# Patient Record
Sex: Female | Born: 1960 | Race: Black or African American | Hispanic: No | Marital: Single
Health system: Southern US, Community
[De-identification: ages and names within clinical notes are randomized; demographics above are authoritative.]

---

## 2021-07-15 ENCOUNTER — Emergency Department: Payer: BC Managed Care – PPO

## 2021-07-15 ENCOUNTER — Emergency Department
Admission: EM | Admit: 2021-07-15 | Discharge: 2021-07-15 | Disposition: A | Discharge status: Home / Self Care | Payer: BC Managed Care – PPO | Attending: Emergency Medicine | Admitting: Emergency Medicine

## 2021-07-15 DIAGNOSIS — I129 Hypertensive chronic kidney disease with stage 1 through stage 4 chronic kidney disease, or unspecified chronic kidney disease: Secondary | ICD-10-CM | POA: Diagnosis not present

## 2021-07-15 DIAGNOSIS — R531 Weakness: Secondary | ICD-10-CM | POA: Insufficient documentation

## 2021-07-15 DIAGNOSIS — R4182 Altered mental status, unspecified: Secondary | ICD-10-CM | POA: Insufficient documentation

## 2021-07-15 DIAGNOSIS — N189 Chronic kidney disease, unspecified: Secondary | ICD-10-CM | POA: Diagnosis not present

## 2021-07-15 LAB — CBC WITH DIFFERENTIAL/PLATELET
Abs Immature Granulocytes: 0.01 10*3/uL (ref 0.00–0.07)
Basophils Absolute: 0 10*3/uL (ref 0.0–0.1)
Basophils Relative: 1 %
Eosinophils Absolute: 0.2 10*3/uL (ref 0.0–0.5)
Eosinophils Relative: 3 %
HCT: 40.8 % (ref 36.0–46.0)
Hemoglobin: 12.3 g/dL (ref 12.0–15.0)
Immature Granulocytes: 0 %
Lymphocytes Relative: 36 %
Lymphs Abs: 1.8 10*3/uL (ref 0.7–4.0)
MCH: 26.5 pg (ref 26.0–34.0)
MCHC: 30.1 g/dL (ref 30.0–36.0)
MCV: 87.9 fL (ref 80.0–100.0)
Monocytes Absolute: 0.5 10*3/uL (ref 0.1–1.0)
Monocytes Relative: 9 %
Neutro Abs: 2.6 10*3/uL (ref 1.7–7.7)
Neutrophils Relative %: 51 %
Platelets: 281 10*3/uL (ref 150–400)
RBC: 4.64 MIL/uL (ref 3.87–5.11)
RDW: 14.6 % (ref 11.5–15.5)
WBC: 5.1 10*3/uL (ref 4.0–10.5)
nRBC: 0 % (ref 0.0–0.2)

## 2021-07-15 LAB — URINE DRUG SCREEN, QUALITATIVE (ARMC ONLY)
Amphetamines, Ur Screen: NOT DETECTED
Barbiturates, Ur Screen: NOT DETECTED
Benzodiazepine, Ur Scrn: NOT DETECTED
Cannabinoid 50 Ng, Ur ~~LOC~~: NOT DETECTED
Cocaine Metabolite,Ur ~~LOC~~: NOT DETECTED
MDMA (Ecstasy)Ur Screen: NOT DETECTED
Methadone Scn, Ur: NOT DETECTED
Opiate, Ur Screen: NOT DETECTED
Phencyclidine (PCP) Ur S: NOT DETECTED
Tricyclic, Ur Screen: POSITIVE — AB

## 2021-07-15 LAB — TROPONIN I (HIGH SENSITIVITY)
Troponin I (High Sensitivity): 5 ng/L (ref <18)
Troponin I (High Sensitivity): 4 ng/L (ref <18)

## 2021-07-15 LAB — COMPREHENSIVE METABOLIC PANEL
ALT: 17 U/L (ref 0–44)
AST: 24 U/L (ref 15–41)
Albumin: 3.8 g/dL (ref 3.5–5.0)
Alkaline Phosphatase: 94 U/L (ref 38–126)
Anion gap: 10 (ref 5–15)
BUN: 25 mg/dL — ABNORMAL HIGH (ref 6–20)
CO2: 21 mmol/L — ABNORMAL LOW (ref 22–32)
Calcium: 9.1 mg/dL (ref 8.9–10.3)
Chloride: 104 mmol/L (ref 98–111)
Creatinine, Ser: 1.52 mg/dL — ABNORMAL HIGH (ref 0.44–1.00)
GFR, Estimated: 39 mL/min — ABNORMAL LOW (ref >60)
Glucose, Bld: 132 mg/dL — ABNORMAL HIGH (ref 70–99)
Potassium: 4.6 mmol/L (ref 3.5–5.1)
Sodium: 135 mmol/L (ref 135–145)
Total Bilirubin: 0.6 mg/dL (ref 0.3–1.2)
Total Protein: 7.3 g/dL (ref 6.5–8.1)

## 2021-07-15 LAB — LACTIC ACID, PLASMA
Lactic Acid, Venous: 2.7 mmol/L (ref 0.5–1.9)
Lactic Acid, Venous: 0.8 mmol/L (ref 0.5–1.9)

## 2021-07-15 LAB — MAGNESIUM: Magnesium: 2.4 mg/dL (ref 1.7–2.4)

## 2021-07-15 LAB — CBG MONITORING, ED: Glucose-Capillary: 85 mg/dL (ref 70–99)

## 2021-07-15 IMAGING — CT CT HEAD W/O CM
4 series · 16 of 47 positions shown, 18 images · non-contrast
Comparison: None.

CLINICAL DATA: Altered mental status.



[Series 2: head wo · axial · 0.43mm/px · z∈[-91,+14]mm · 7 of 29 slices shown, 9 images]
[im 4/29  brain]
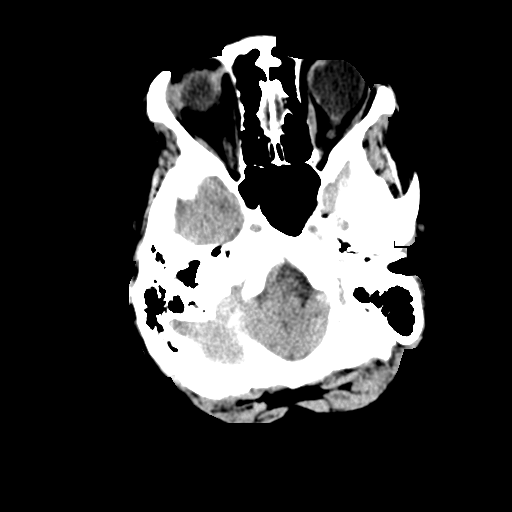
[im 4/29  bone]
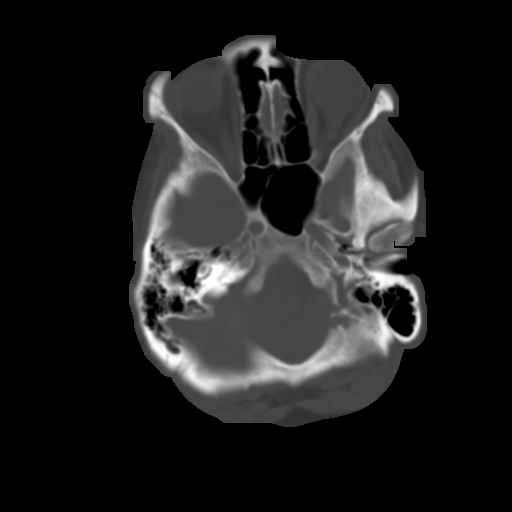
[im 8/29  brain]
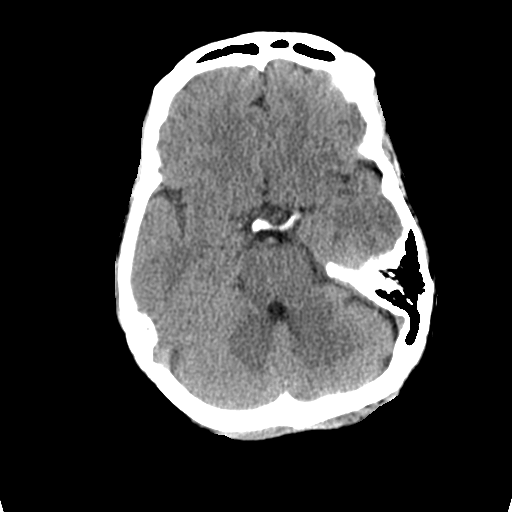
[im 11/29  brain]
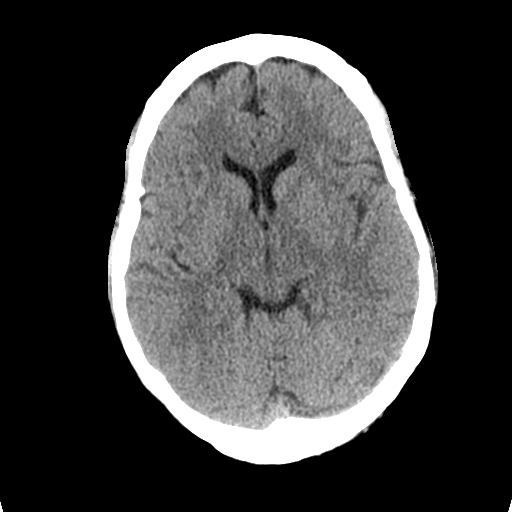
[im 15/29  brain]
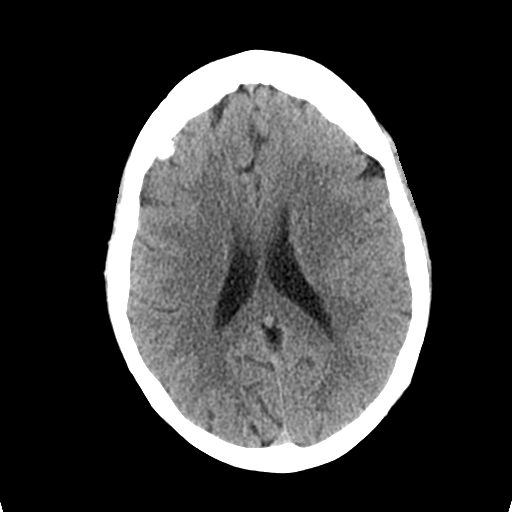
[im 18/29  brain]
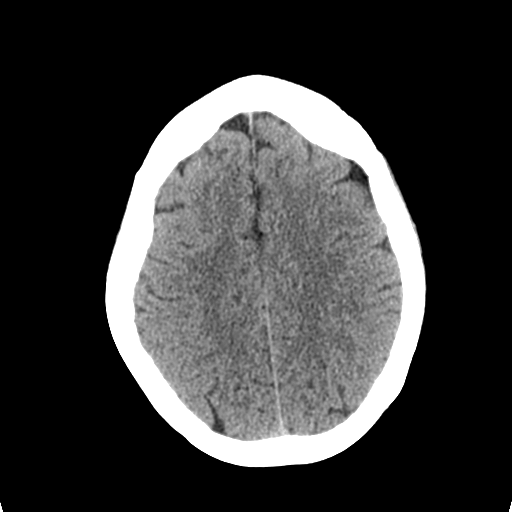
[im 18/29  bone]
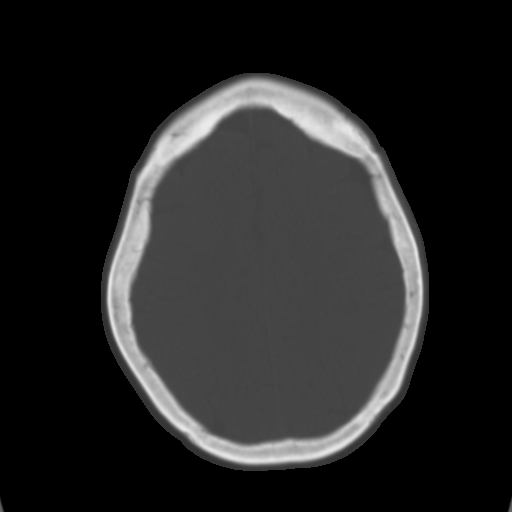
[im 22/29  brain]
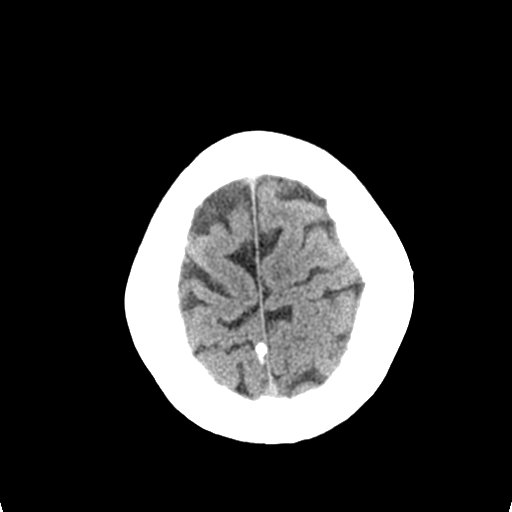
[im 25/29  brain]
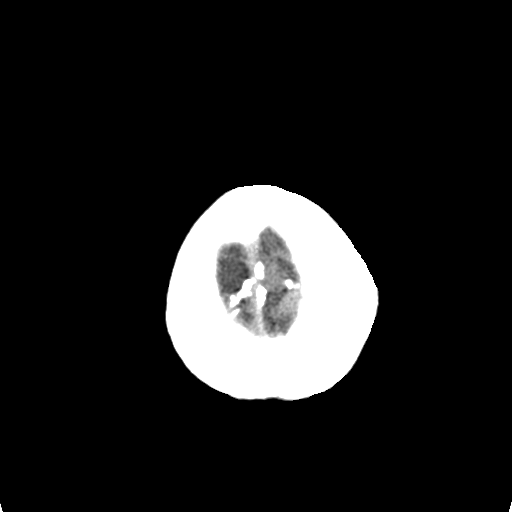

[Series 3: head bone · axial · 0.43mm/px · z∈[-92,-64]mm · 3 of 73 slices shown]
[im 8/73  bone]
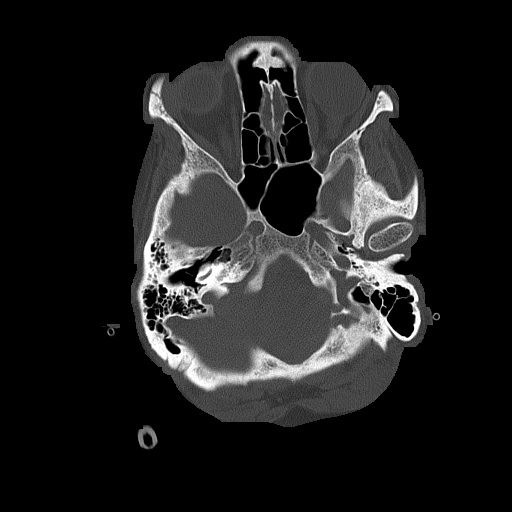
[im 15/73  bone]
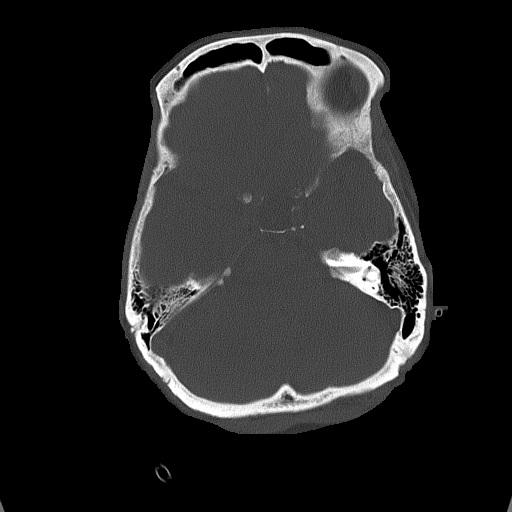
[im 22/73  bone]
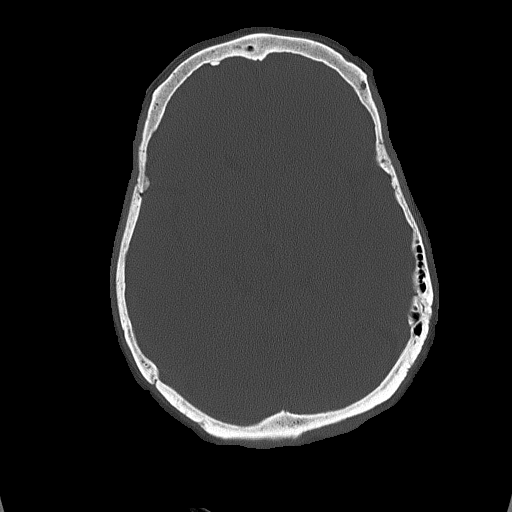

[Series 4: coronal soft tissue · coronal · 0.29mm/px · 3 of 66 slices shown]
[im 22/66  brain]
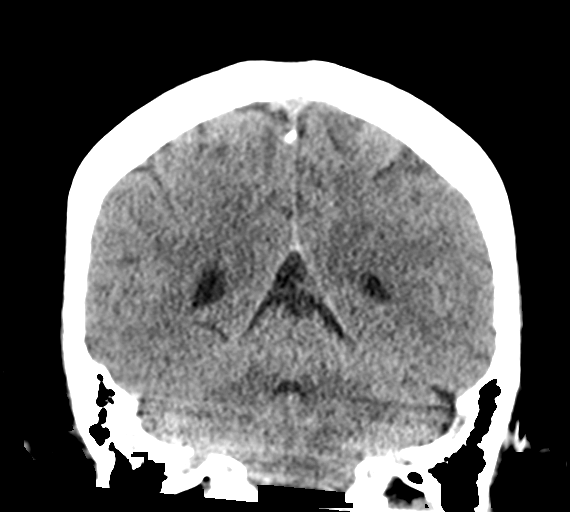
[im 29/66  brain]
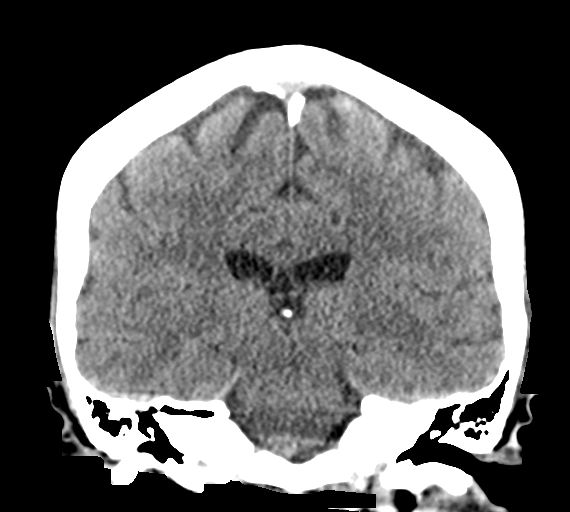
[im 37/66  brain]
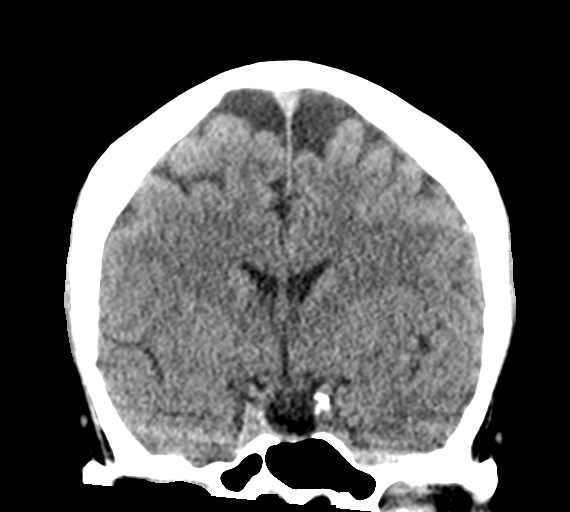

[Series 5: sagittal soft tissue · sagittal · 0.29mm/px · 3 of 56 slices shown]
[im 19/56  brain]
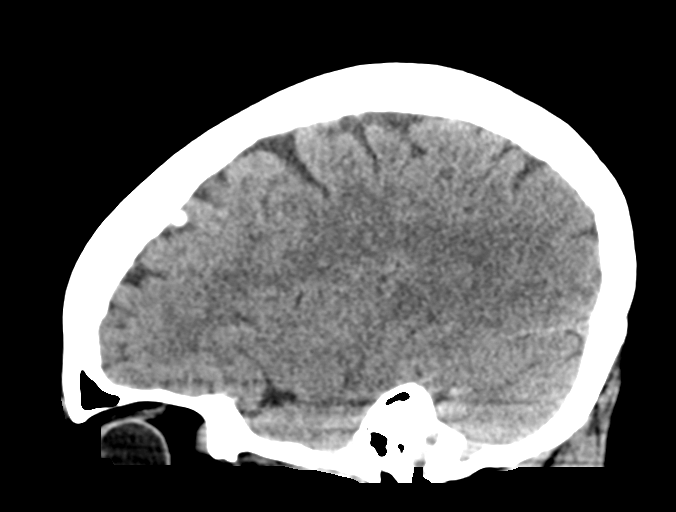
[im 28/56  brain]
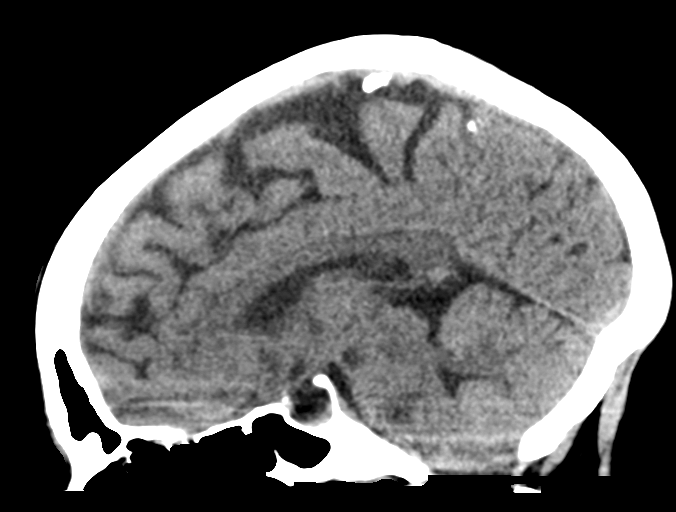
[im 37/56  brain]
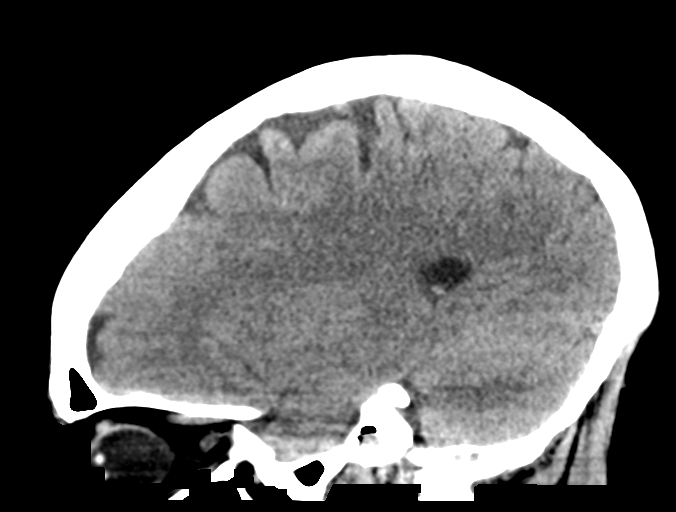

[16 of 47 positions shown; findings below may reference images not displayed]

FINDINGS: Brain: No evidence of acute infarction, hemorrhage, hydrocephalus,
extra-axial collection or mass lesion/mass effect.

Vascular: No hyperdense vessel or unexpected calcification.

Skull: Normal. Negative for fracture or focal lesion.

Sinuses/Orbits: No acute finding.

Other: None.
IMPRESSION: No acute intracranial abnormality seen.

## 2021-07-15 IMAGING — MR MR HEAD W/O CM
9 series · 48 of 48 positions shown · non-contrast
Comparison: Same-day CT head

CLINICAL DATA: Left-sided weakness, aphasia



[Series 5: ax dwi_tracew · axial · 3.0mm · 1.80mm/px · z∈[-139,+14]mm · 6 of 48 slices shown]
[im 1/48]
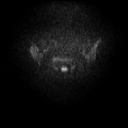
[im 10/48]
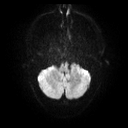
[im 19/48]
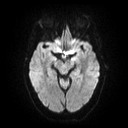
[im 29/48]
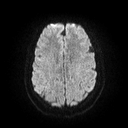
[im 38/48]
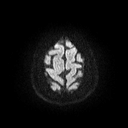
[im 48/48]
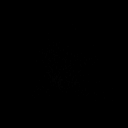

[Series 6: ax dwi_adc · axial · 3.0mm · 1.80mm/px · z∈[-139,+8]mm · 5 of 46 slices shown]
[im 1/46]
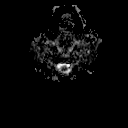
[im 12/46]
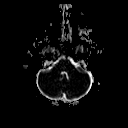
[im 23/46]
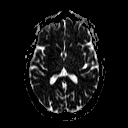
[im 34/46]
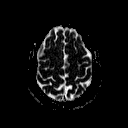
[im 46/46]
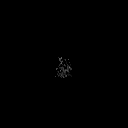

[Series 7: cor dwi_tracew · coronal · 5.0mm · 1.80mm/px · 4 of 38 slices shown]
[im 1/38]
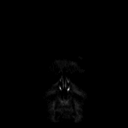
[im 13/38]
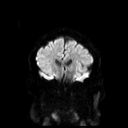
[im 25/38]
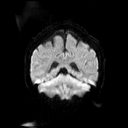
[im 38/38]
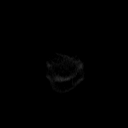

[Series 8: cor dwi_adc · coronal · 5.0mm · 1.80mm/px · 4 of 38 slices shown]
[im 1/38]
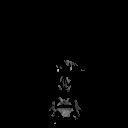
[im 13/38]
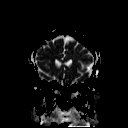
[im 25/38]
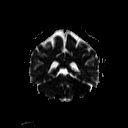
[im 38/38]
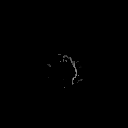

[Series 9: FLAIR · axial · 3.0mm · 0.69mm/px · z∈[-142,+19]mm · 6 of 55 slices shown]
[im 1/55]
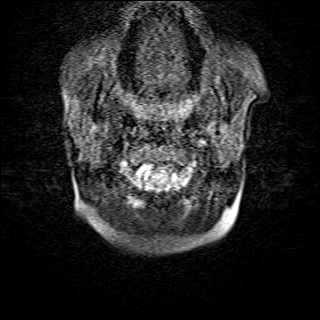
[im 11/55]
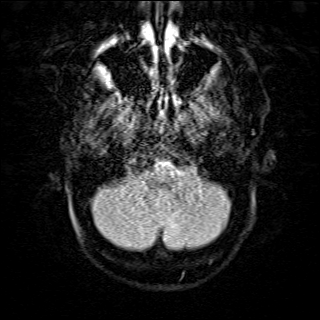
[im 22/55]
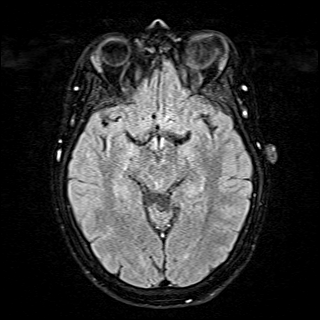
[im 33/55]
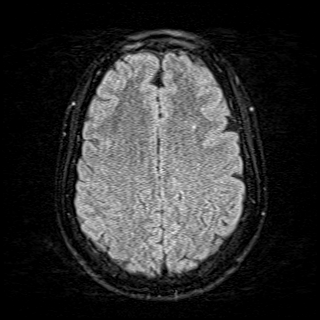
[im 44/55]
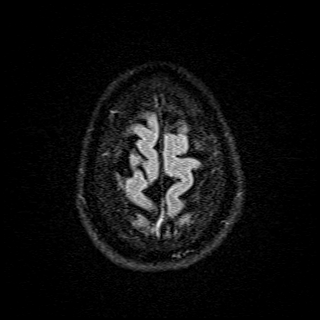
[im 55/55]
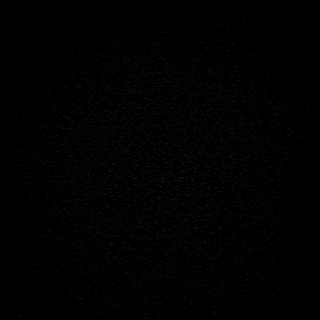

[Series 10: mag_images · axial · 3.0mm · 0.90mm/px · z∈[-138,+14]mm · 6 of 52 slices shown]
[im 1/52]
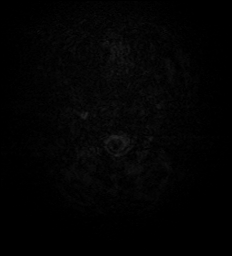
[im 11/52]
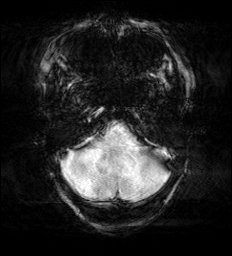
[im 21/52]
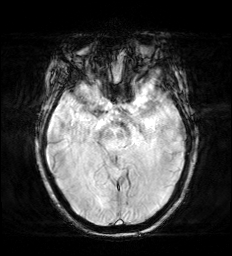
[im 31/52]
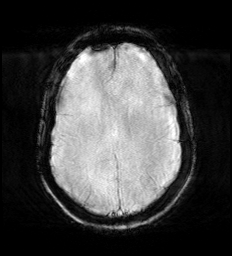
[im 41/52]
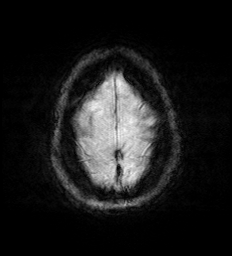
[im 52/52]
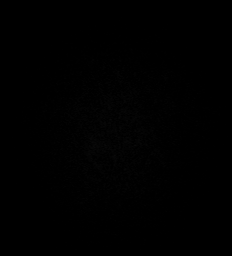

[Series 11: pha_images · axial · 3.0mm · 0.90mm/px · z∈[-138,+8]mm · 6 of 50 slices shown]
[im 1/50]
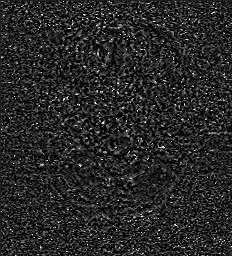
[im 10/50]
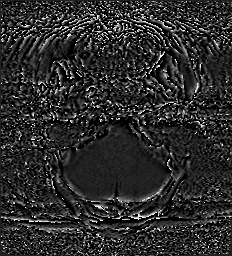
[im 20/50]
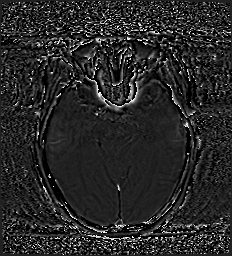
[im 30/50]
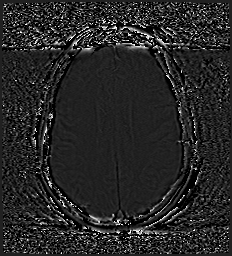
[im 40/50]
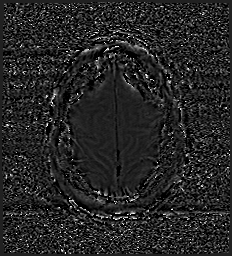
[im 50/50]
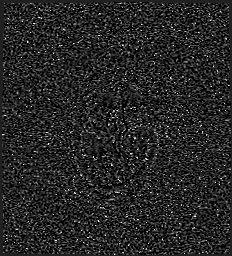

[Series 12: swi_images · axial · 3.0mm · 0.90mm/px · z∈[-138,+14]mm · 6 of 52 slices shown]
[im 1/52]
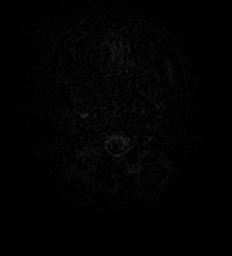
[im 11/52]
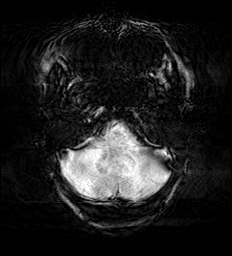
[im 21/52]
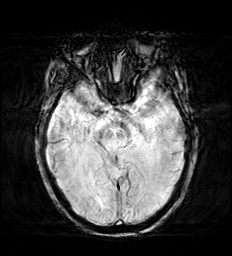
[im 31/52]
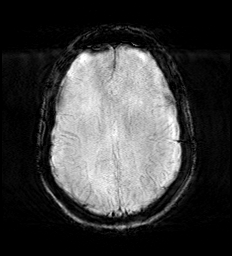
[im 41/52]
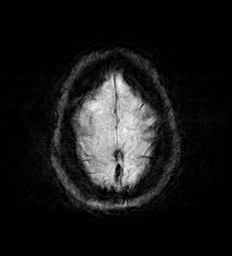
[im 52/52]
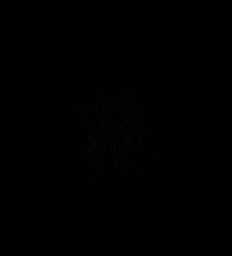

[Series 13: mip_images(sw) · axial · 24.0mm · 0.90mm/px · z∈[-127,+4]mm · 5 of 45 slices shown]
[im 1/45]
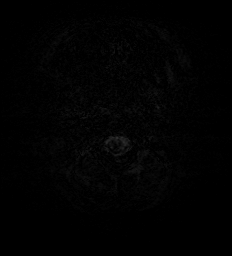
[im 12/45]
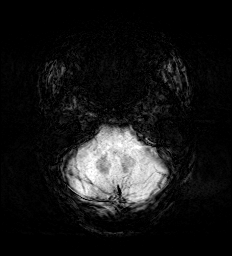
[im 23/45]
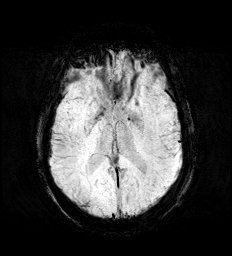
[im 34/45]
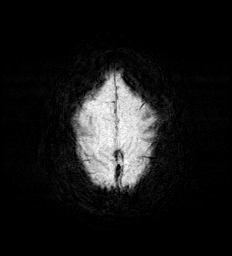
[im 45/45]
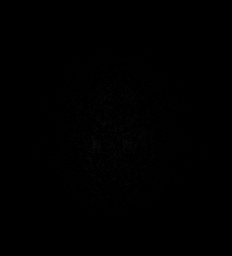

[48 of 48 positions shown; findings below may reference images not displayed]

FINDINGS: MRI HEAD FINDINGS

An abbreviated protocol was performed. Axial and coronal DWI, axial
FLAIR, and axial GRE images were obtained.

Brain: There is no evidence of acute infarct. There is no evidence
of acute intracranial hemorrhage or extra-axial fluid collection on
the provided sequences.

Parenchymal volume is normal. The ventricles are normal in size.
Parenchymal signal is normal on the provided sequences, with no
significant burden of white matter microangiopathic change.

There is no mass lesion.  There is no mass effect or midline shift.

Vascular: See below.

Skull and upper cervical spine: Not well assessed on the provided
sequences.

Sinuses/Orbits: There is an elongated appearance of the left globe
which may reflect sequela of myopia. Bilateral lens implants are in
place. Paranasal sinuses are unremarkable.

Other: None.

MRA HEAD FINDINGS

Anterior circulation: The intracranial ICAs are patent.

The bilateral MCAs are patent.

The bilateral ACAs are patent. The anterior communicating artery is
normal.

There is no aneurysm or AVM.

Posterior circulation: The bilateral V4 segments are patent. PICA is
identified bilaterally. The basilar artery is patent.

The bilateral PCAs are patent.

There is no aneurysm or AVM.

Anatomic variants: None.

MRA NECK FINDINGS

Aortic arch: The aortic arch is normal. The origins of the major
branch vessels appear patent. The subclavian arteries appear patent
to the level imaged.

Right carotid system: The right common, internal, and external
carotid arteries are patent, without evidence of hemodynamically
significant stenosis or occlusion. There is no evidence of
dissection or aneurysm.

Left carotid system: The left common, internal, and external carotid
arteries appear patent, without evidence of hemodynamically
significant stenosis or occlusion. There is no evidence of
dissection or aneurysm.

Vertebral arteries: The vertebral arteries are patent with antegrade
flow, without evidence of hemodynamically significant stenosis or
occlusion. There is no evidence of dissection or aneurysm.

Other: None
IMPRESSION: 1. No evidence of acute infarct or other acute intracranial
pathology identified on the provided sequences. This was discussed
with Dr. ABASEB at the time of DWI image acquisition.
2. Patent vasculature of the head and neck.

## 2021-07-15 IMAGING — MR MR MRA NECK W/O CM
2 of 4 series · 30 of 48 positions shown · non-contrast
Comparison: Same-day CT head

CLINICAL DATA: Left-sided weakness, aphasia



[Series 9: TOF · axial · 0.5mm · 0.54mm/px · z∈[-283,-111]mm · 29 of 388 slices shown (1 of 2)]
[im 1/388]
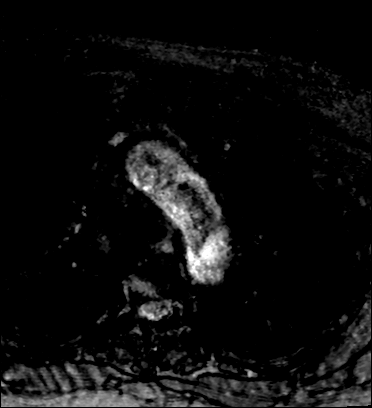
[im 10/388]
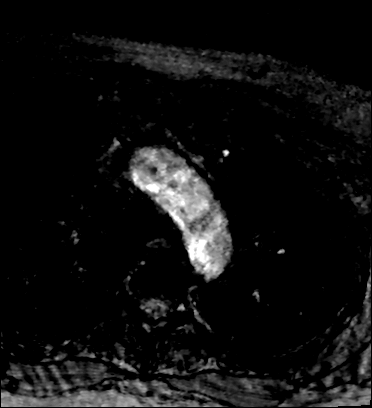
[im 19/388]
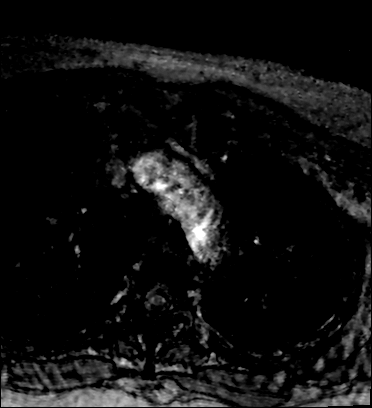
[im 29/388]
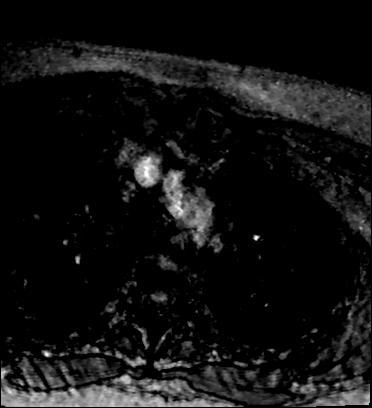
[im 38/388]
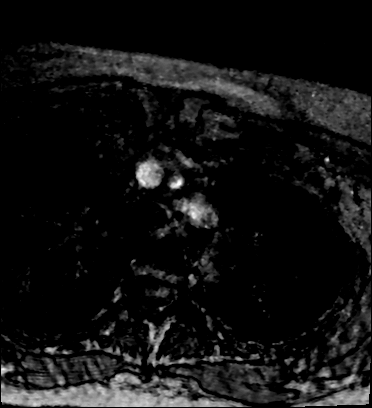
[im 48/388]
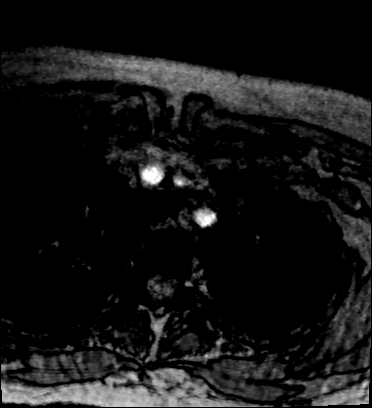
[im 57/388]
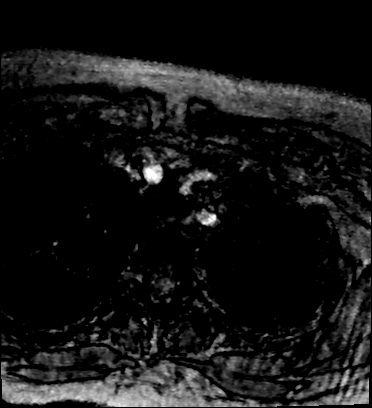
[im 67/388]
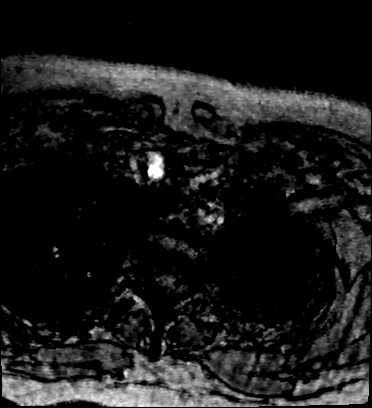
[im 76/388]
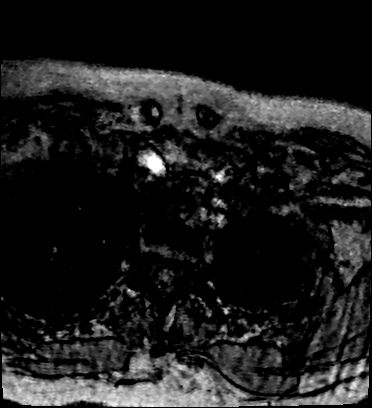
[im 85/388]
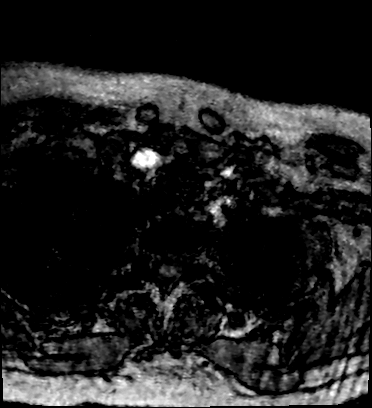
[im 95/388]
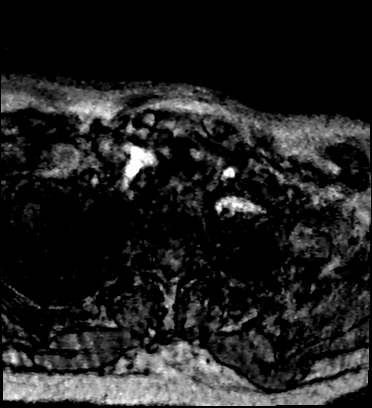
[im 104/388]
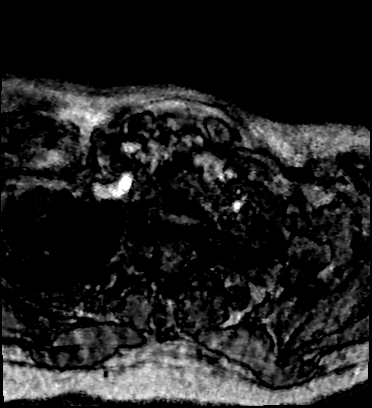
[im 114/388]
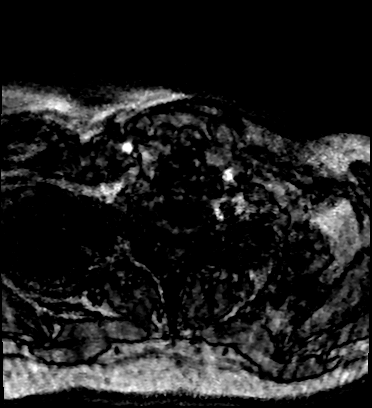
[im 123/388]
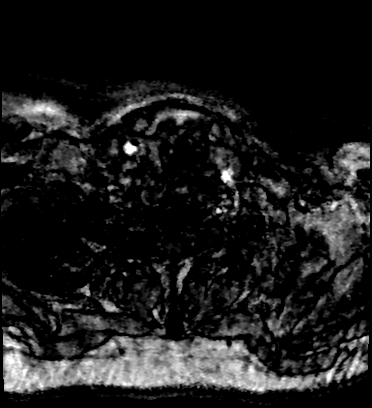
[im 133/388]
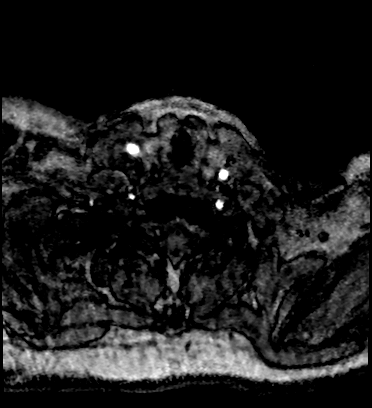
[im 142/388]
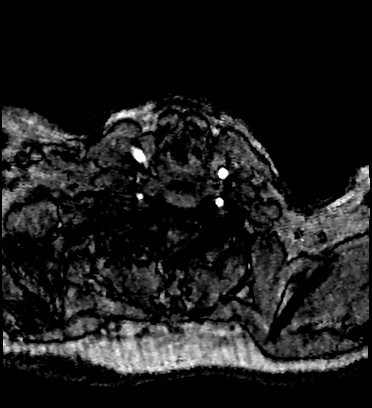
[im 152/388]
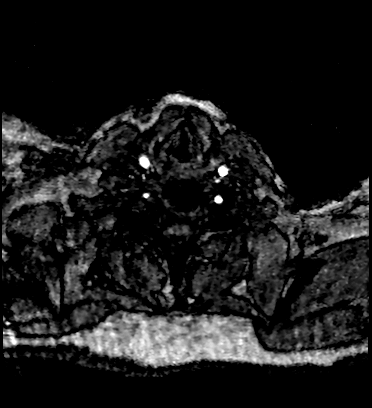
[im 161/388]
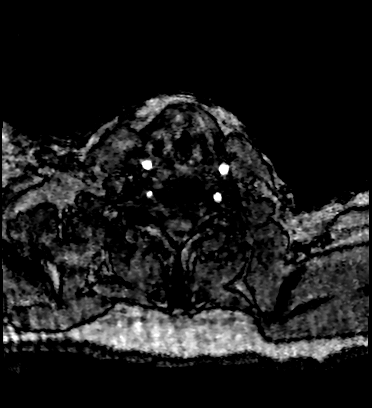
[im 170/388]
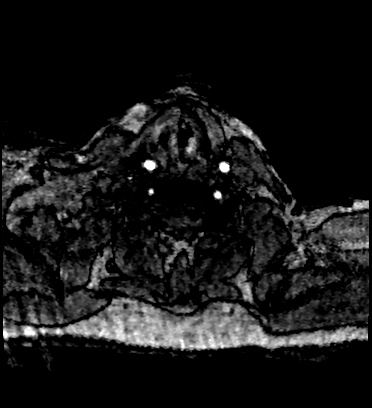
[im 180/388]
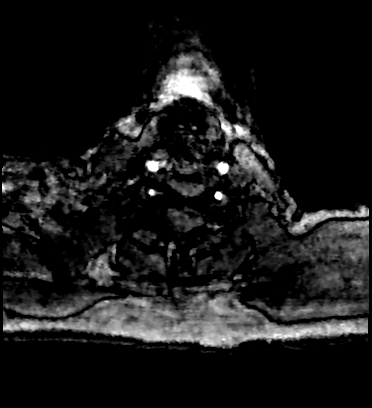
[im 189/388]
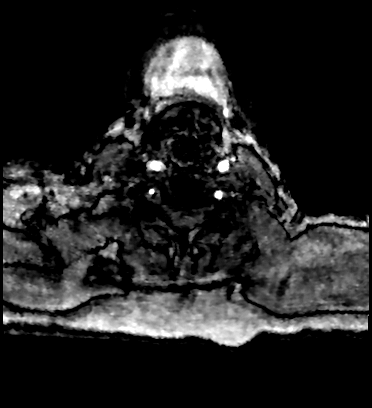
[im 199/388]
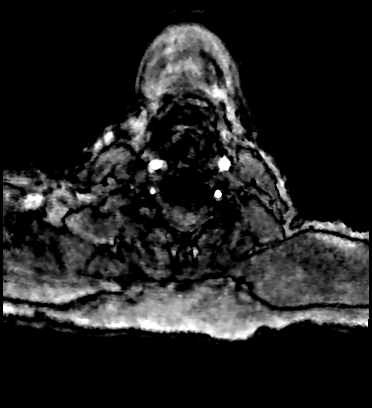
[im 208/388]
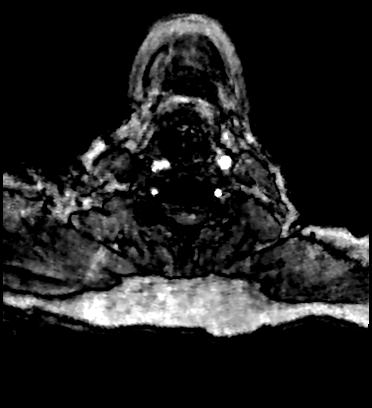
[im 218/388]
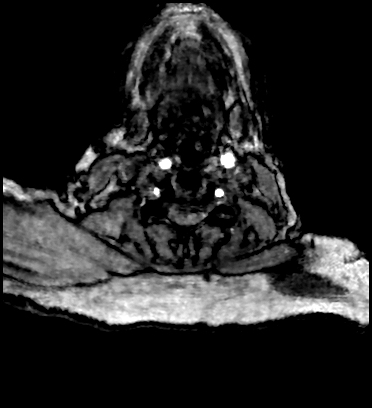
[im 227/388]
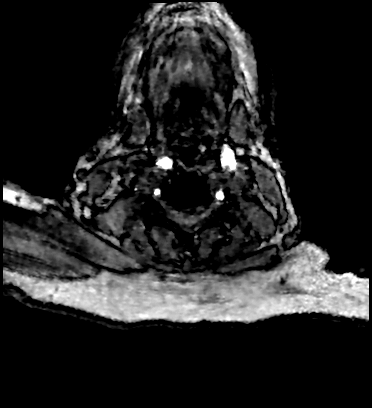
[im 265/388]
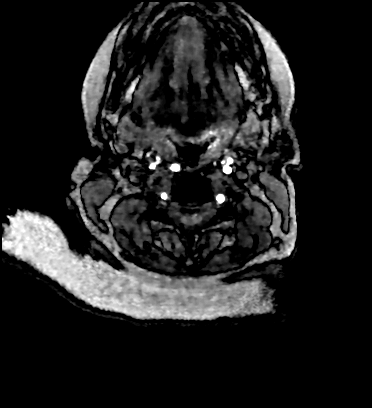
[im 321/388]
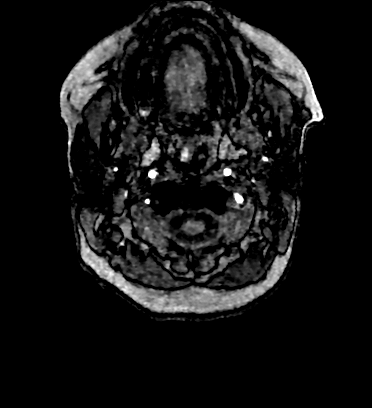
[im 331/388]
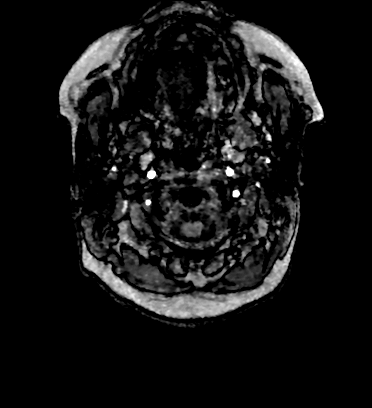
[im 369/388]
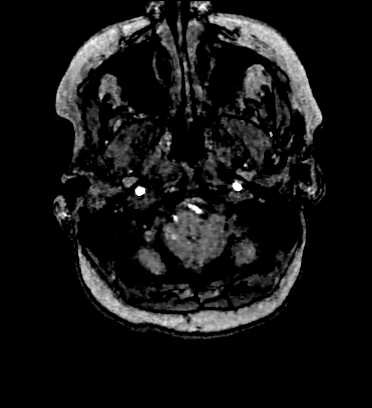

[Series 12: TOF · axial · 182.4mm · 0.54mm/px · 1 of 1 slices shown (2 of 2)]
[im 1/1]
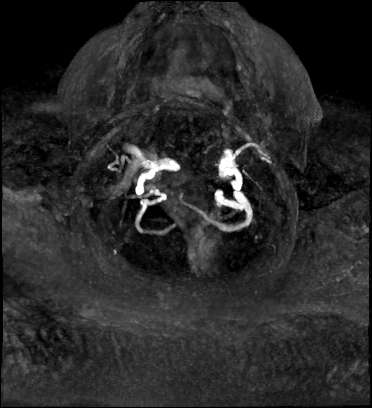

[30 of 48 positions shown; findings below may reference images not displayed]

FINDINGS: MRI HEAD FINDINGS

An abbreviated protocol was performed. Axial and coronal DWI, axial
FLAIR, and axial GRE images were obtained.

Brain: There is no evidence of acute infarct. There is no evidence
of acute intracranial hemorrhage or extra-axial fluid collection on
the provided sequences.

Parenchymal volume is normal. The ventricles are normal in size.
Parenchymal signal is normal on the provided sequences, with no
significant burden of white matter microangiopathic change.

There is no mass lesion.  There is no mass effect or midline shift.

Vascular: See below.

Skull and upper cervical spine: Not well assessed on the provided
sequences.

Sinuses/Orbits: There is an elongated appearance of the left globe
which may reflect sequela of myopia. Bilateral lens implants are in
place. Paranasal sinuses are unremarkable.

Other: None.

MRA HEAD FINDINGS

Anterior circulation: The intracranial ICAs are patent.

The bilateral MCAs are patent.

The bilateral ACAs are patent. The anterior communicating artery is
normal.

There is no aneurysm or AVM.

Posterior circulation: The bilateral V4 segments are patent. PICA is
identified bilaterally. The basilar artery is patent.

The bilateral PCAs are patent.

There is no aneurysm or AVM.

Anatomic variants: None.

MRA NECK FINDINGS

Aortic arch: The aortic arch is normal. The origins of the major
branch vessels appear patent. The subclavian arteries appear patent
to the level imaged.

Right carotid system: The right common, internal, and external
carotid arteries are patent, without evidence of hemodynamically
significant stenosis or occlusion. There is no evidence of
dissection or aneurysm.

Left carotid system: The left common, internal, and external carotid
arteries appear patent, without evidence of hemodynamically
significant stenosis or occlusion. There is no evidence of
dissection or aneurysm.

Vertebral arteries: The vertebral arteries are patent with antegrade
flow, without evidence of hemodynamically significant stenosis or
occlusion. There is no evidence of dissection or aneurysm.

Other: None
IMPRESSION: 1. No evidence of acute infarct or other acute intracranial
pathology identified on the provided sequences. This was discussed
with Dr. ABASEB at the time of DWI image acquisition.
2. Patent vasculature of the head and neck.

## 2021-07-15 IMAGING — CR DG CHEST 1V PORT
1 series · 1 of 1 positions shown · non-contrast
Comparison: None.

CLINICAL DATA: Chest pain

EXAM:
PORTABLE CHEST 1 VIEW

[chest ap]
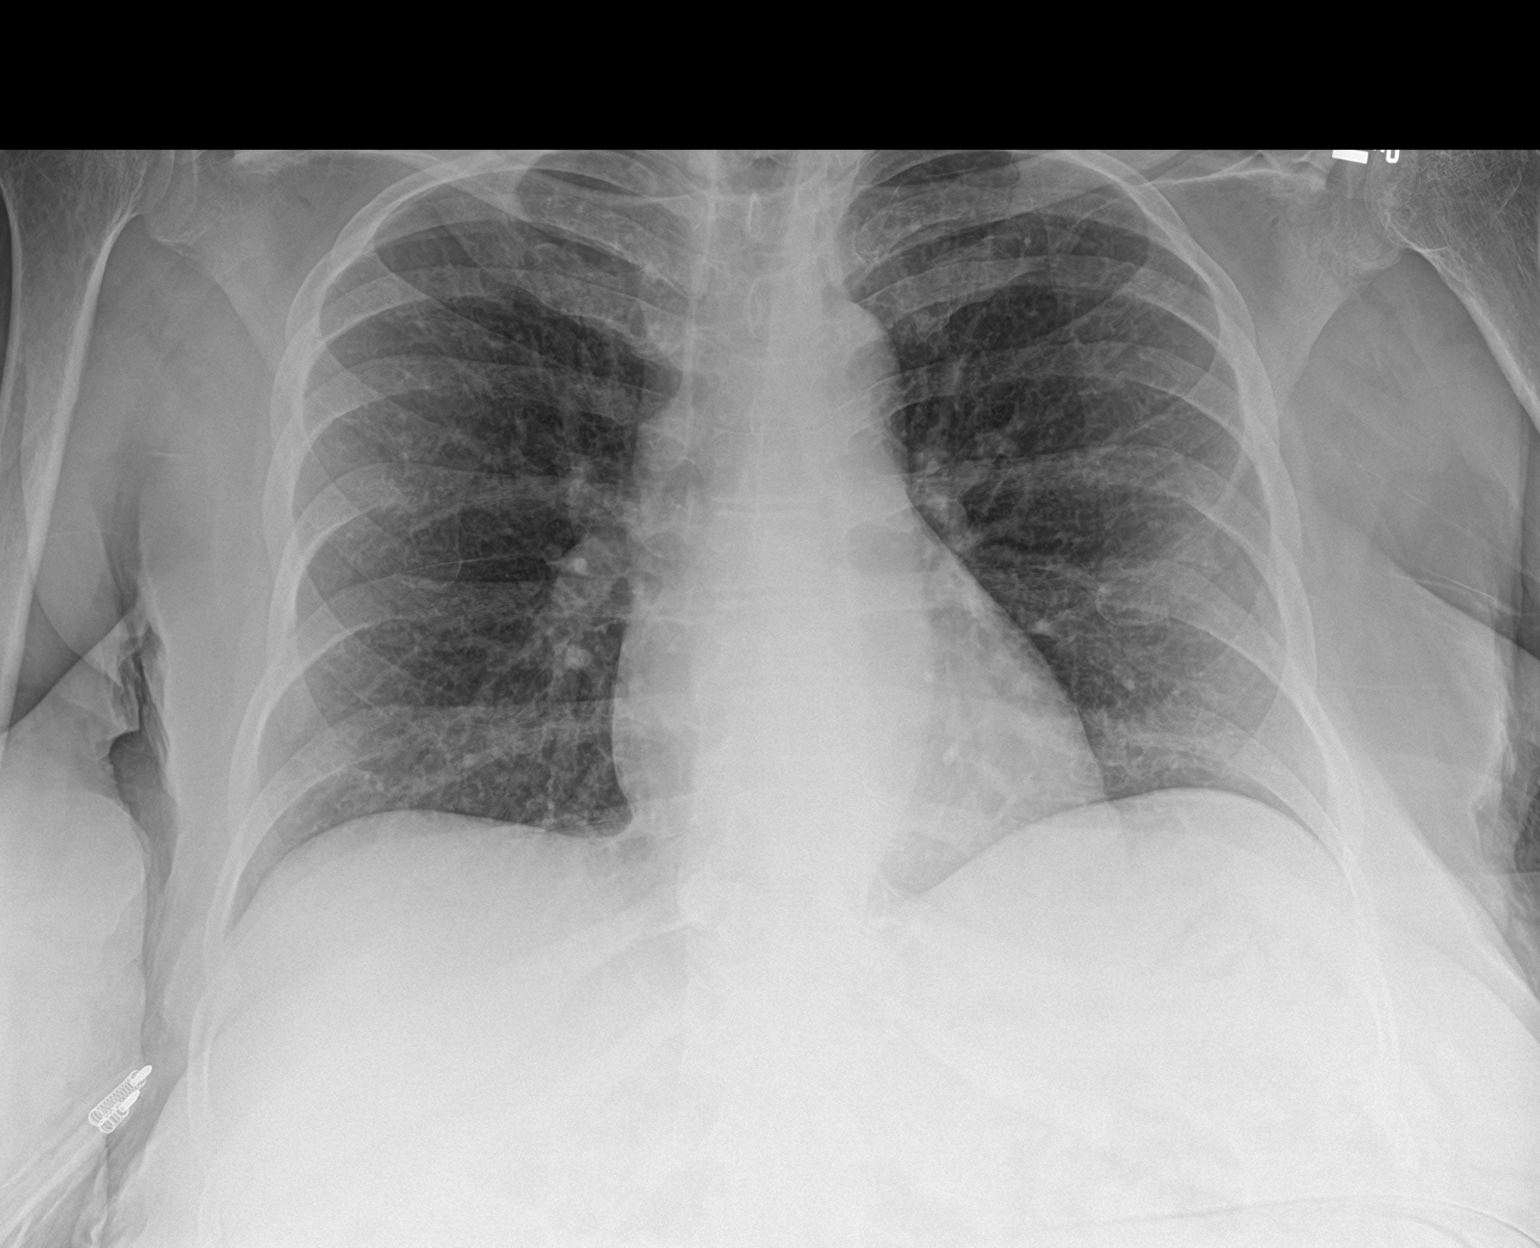

[1 of 1 positions shown; findings below may reference images not displayed]

FINDINGS: Heart size and mediastinal contours are within normal limits. No
suspicious pulmonary opacities identified.

No pleural effusion or pneumothorax visualized.

No acute osseous abnormality appreciated.
IMPRESSION: No acute intrathoracic process identified.

## 2021-07-15 IMAGING — MR MR MRA HEAD W/O CM
1 series · 18 of 48 positions shown · non-contrast
Comparison: Same-day CT head

CLINICAL DATA: Left-sided weakness, aphasia



[Series 5: TOF · axial · 0.5mm · 0.41mm/px · z∈[-140,-43]mm · 18 of 205 slices shown]
[im 1/205]
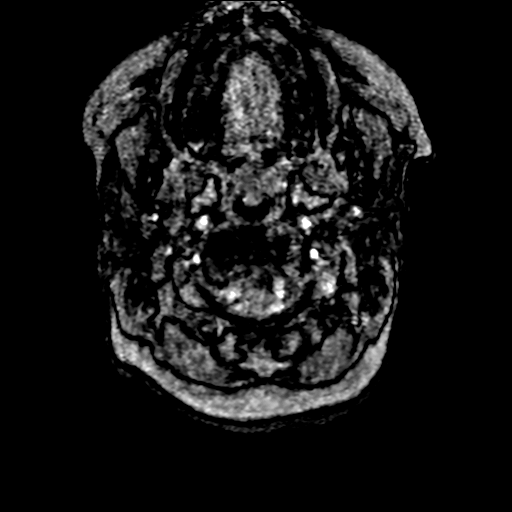
[im 5/205]
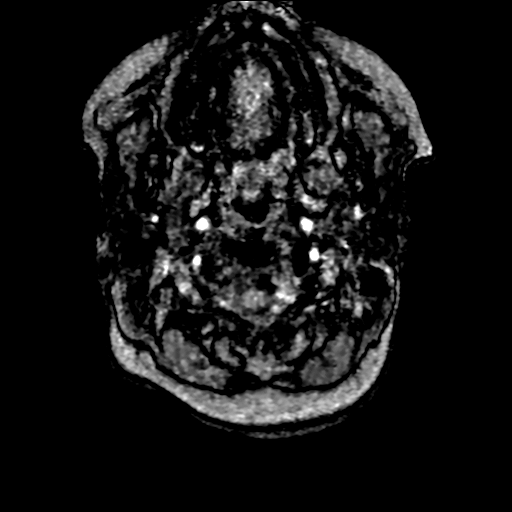
[im 9/205]
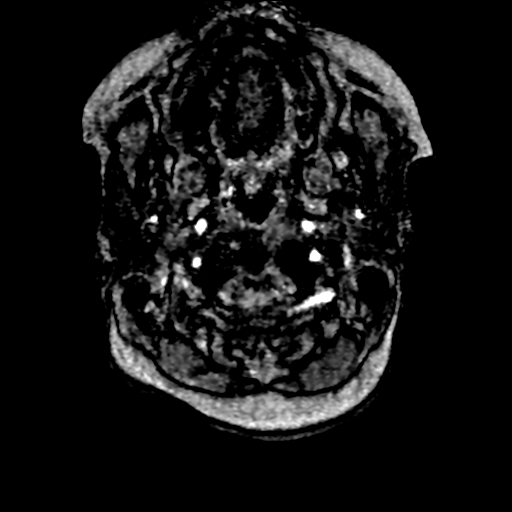
[im 14/205]
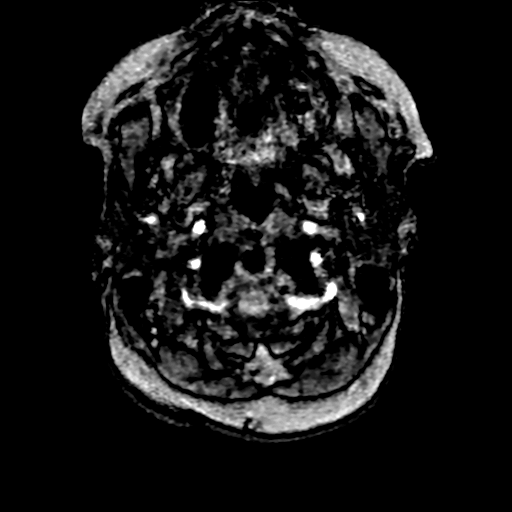
[im 18/205]
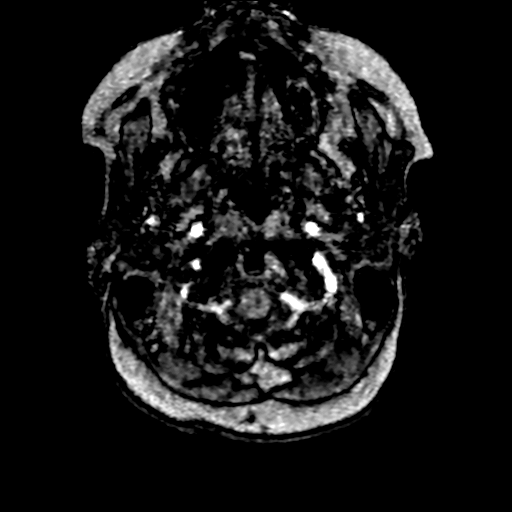
[im 22/205]
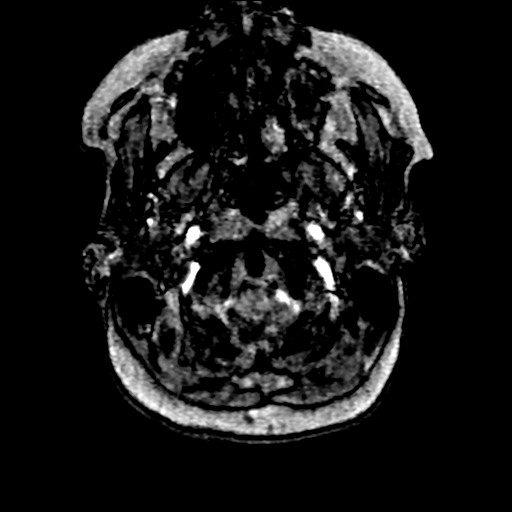
[im 27/205]
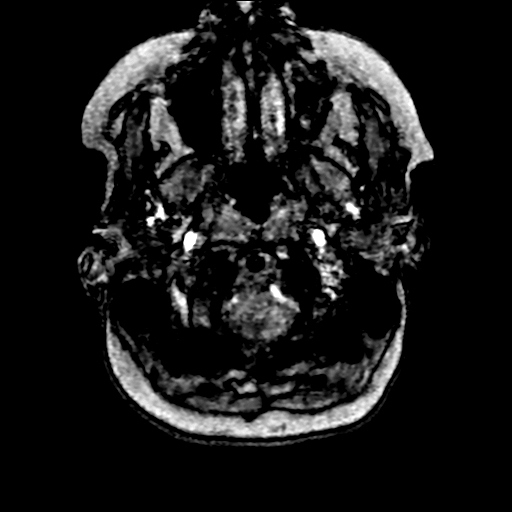
[im 31/205]
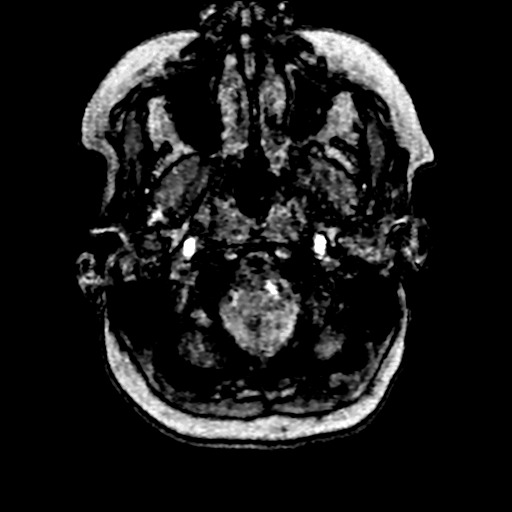
[im 35/205]
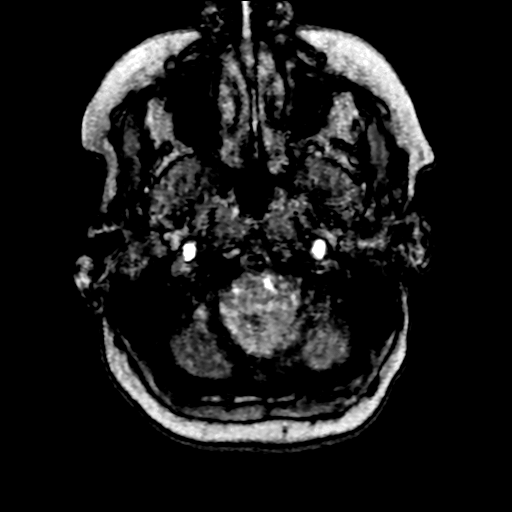
[im 40/205]
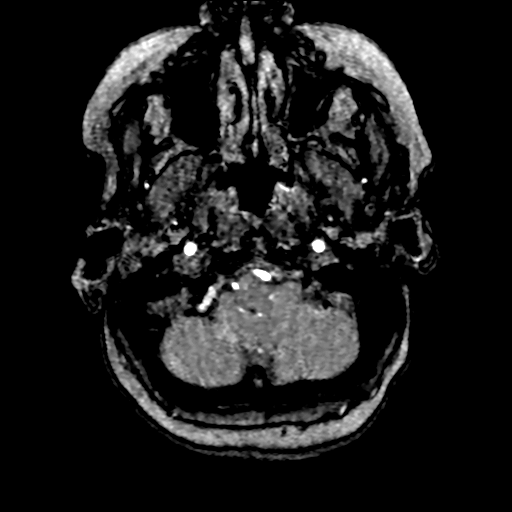
[im 66/205]
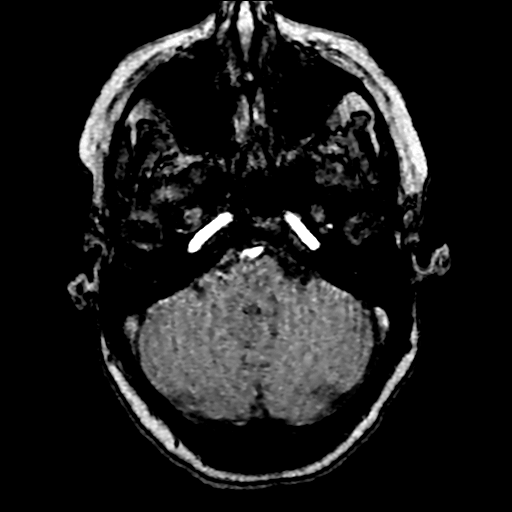
[im 92/205]
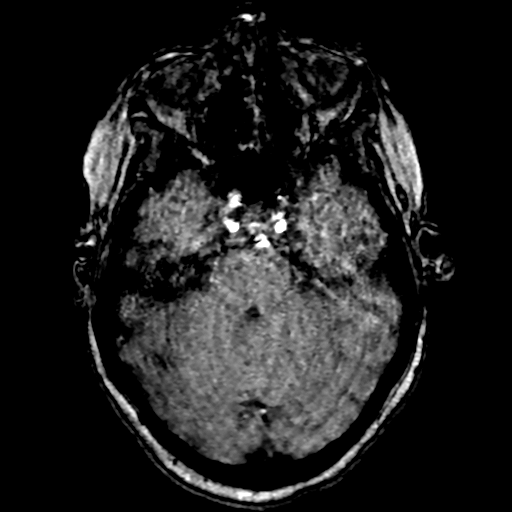
[im 105/205]
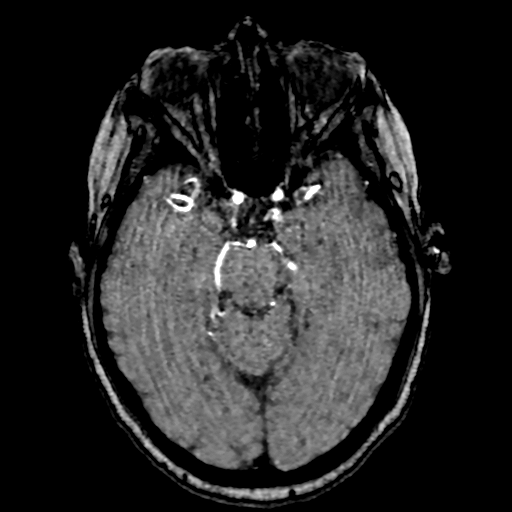
[im 118/205]
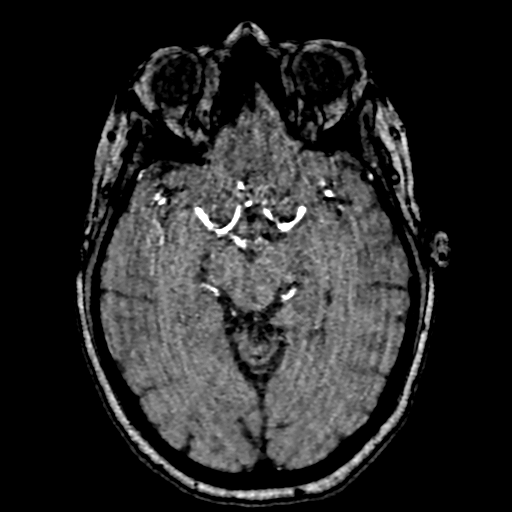
[im 144/205]
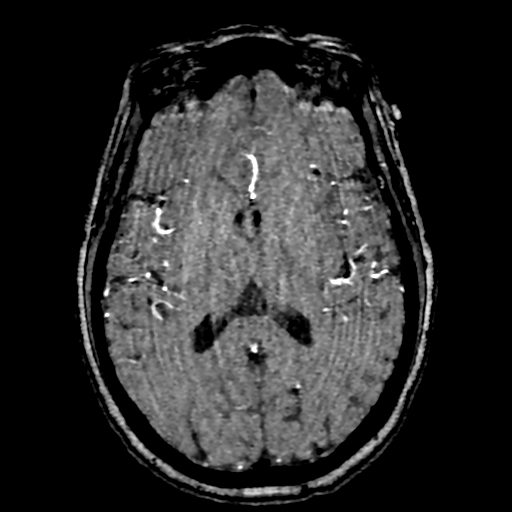
[im 170/205]
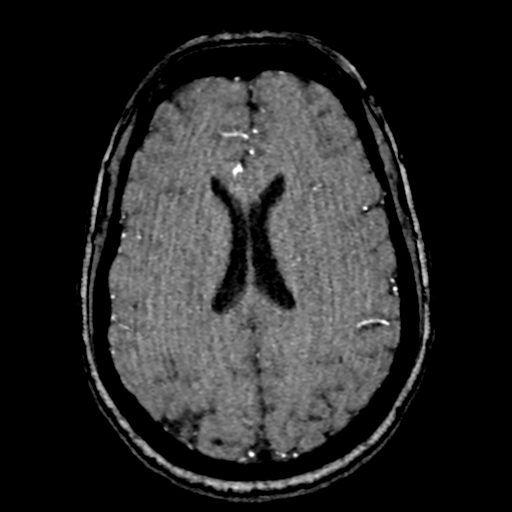
[im 174/205]
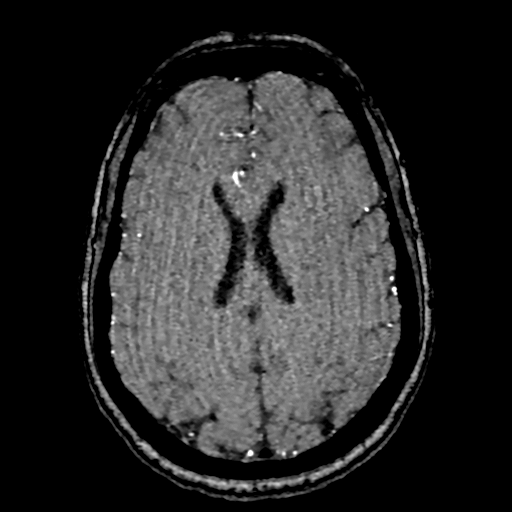
[im 196/205]
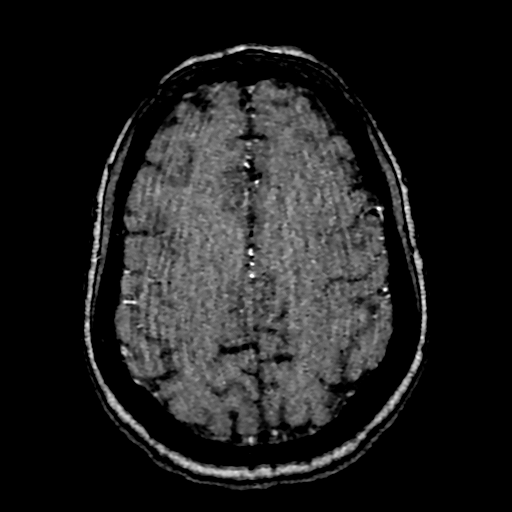

[18 of 48 positions shown; findings below may reference images not displayed]

FINDINGS: MRI HEAD FINDINGS

An abbreviated protocol was performed. Axial and coronal DWI, axial
FLAIR, and axial GRE images were obtained.

Brain: There is no evidence of acute infarct. There is no evidence
of acute intracranial hemorrhage or extra-axial fluid collection on
the provided sequences.

Parenchymal volume is normal. The ventricles are normal in size.
Parenchymal signal is normal on the provided sequences, with no
significant burden of white matter microangiopathic change.

There is no mass lesion.  There is no mass effect or midline shift.

Vascular: See below.

Skull and upper cervical spine: Not well assessed on the provided
sequences.

Sinuses/Orbits: There is an elongated appearance of the left globe
which may reflect sequela of myopia. Bilateral lens implants are in
place. Paranasal sinuses are unremarkable.

Other: None.

MRA HEAD FINDINGS

Anterior circulation: The intracranial ICAs are patent.

The bilateral MCAs are patent.

The bilateral ACAs are patent. The anterior communicating artery is
normal.

There is no aneurysm or AVM.

Posterior circulation: The bilateral V4 segments are patent. PICA is
identified bilaterally. The basilar artery is patent.

The bilateral PCAs are patent.

There is no aneurysm or AVM.

Anatomic variants: None.

MRA NECK FINDINGS

Aortic arch: The aortic arch is normal. The origins of the major
branch vessels appear patent. The subclavian arteries appear patent
to the level imaged.

Right carotid system: The right common, internal, and external
carotid arteries are patent, without evidence of hemodynamically
significant stenosis or occlusion. There is no evidence of
dissection or aneurysm.

Left carotid system: The left common, internal, and external carotid
arteries appear patent, without evidence of hemodynamically
significant stenosis or occlusion. There is no evidence of
dissection or aneurysm.

Vertebral arteries: The vertebral arteries are patent with antegrade
flow, without evidence of hemodynamically significant stenosis or
occlusion. There is no evidence of dissection or aneurysm.

Other: None
IMPRESSION: 1. No evidence of acute infarct or other acute intracranial
pathology identified on the provided sequences. This was discussed
with Dr. ABASEB at the time of DWI image acquisition.
2. Patent vasculature of the head and neck.

## 2021-07-15 NOTE — ED Notes (Signed)
Pt back from CT, EDP is at bedside examining pt. Pt now movinb eyes, gaze is not deviated to R as it was. Pt was noted to have twitching of both arms while in CT. Pt is still not responding verbally. Pt is nodding to questions. ?

## 2021-07-15 NOTE — ED Notes (Signed)
Assisted pt to walk to toilet in room. Pt had to try 3 times because she felt weakness on L leg but was able to walk across room and back to bed on third try. Urine collected.  ? ?Pt is now speaking. Pt is oriented times 4.  ?

## 2021-07-15 NOTE — Discharge Instructions (Signed)
Your blood work, CT scans and MRIs were all reassuring. You may have had weakness induced by stressors. Please rest and relax and follow-up with your primary care provider. If you have recurrent symptoms, please return to the emergency department.  ?

## 2021-07-15 NOTE — ED Notes (Signed)
Pt was accompanied to MRI and now is in xray.  ? ?Gold earrings and bracelet handed to daughter of pt. ?

## 2021-07-15 NOTE — ED Notes (Signed)
CBG is 85. ?

## 2021-07-15 NOTE — ED Notes (Signed)
Activated code stroke to [facility] Tina  1155 ?

## 2021-07-15 NOTE — ED Notes (Signed)
Code stroke was called. EDP still at bedside. LKW was 1115. Pt was at work and collapsed, helped to floor, did not hit head. Pt had been complaining of CP all morning. ?

## 2021-07-15 NOTE — Consult Note (Signed)
CODE STROKE- PHARMACY COMMUNICATION ? ? ?Time CODE STROKE called/page received:1157 ? ?Time response to CODE STROKE was made (in person or via phone):  ? ?Time Stroke Kit retrieved from [place] (only if needed): not indicated per neurology ? ?Name of Provider/Nurse contacted:Dr Leonel Ramsay ? ?No past medical history on file. ? ? ? ?Cindy Cantu PharmD, BCPS ?07/15/2021 12:29 PM ? ? ? ?

## 2021-07-15 NOTE — Significant Event (Signed)
Rapid Response Event Note  ? ?Reason for Call :  ?Unresponsive ? ?Initial Focused Assessment:  ? ?Patient found on floor(per bedside RN- they slid her on floor- patient did not hit her head)- unresponsive. No pulse detected- I started CPR briefly and patient was moving- pulse was thready, pt still not responsive.  We administered rescue breaths.  Patient's vitals stable.  Blood sugar 118.   ? ?Interventions:  ? ? ?Plan of Care:  ?Patient transferred to ED ? ? ?Event Summary:  ? ?MD Notified:  ?Call Time: ?Arrival Time: ?End Time: ? ?Aasiyah Auerbach J, RN ?

## 2021-07-15 NOTE — Progress Notes (Signed)
?   07/15/21 1400  ?Clinical Encounter Type  ?Visited With Patient and family together  ?Visit Type Initial  ?Spiritual Encounters  ?Spiritual Needs Prayer  ? ?Chaplain followed up on nurse who had an episode in Dialysis unit. Patient requested prayer. ?

## 2021-07-15 NOTE — ED Provider Notes (Signed)
? ?[facility] ?Provider Note ? ? ? Event Date/Time  ? First MD Initiated Contact with Patient 07/15/21 1123   ?  (approximate) ? ? ?History  ? ?Altered Mental Status ? ? ?HPI ? ?Patient is a 61 yo female with a pmh of HTN, CKD who presents with altered mental status. Patient is a dialysis nurse, was noted to nearly collapse, and was lowered to the ground.  Currently patient had complained of some chest pressure earlier in the day.  Did obtain history from the patient given her altered mental status. ? ?  ? ?No past medical history on file. ? ?There are no problems to display for this patient. ? ? ? ?Physical Exam  ?Triage Vital Signs: ?ED Triage Vitals  ?Enc Vitals Group  ?   BP   ?   Pulse   ?   Resp   ?   Temp   ?   Temp src   ?   SpO2   ?   Weight   ?   Height   ?   Head Circumference   ?   Peak Flow   ?   Pain Score   ?   Pain Loc   ?   Pain Edu?   ?   Excl. in GC?   ? ? ?Most recent vital signs: ?Vitals:  ? 07/15/21 1600 07/15/21 1615  ?BP: 119/77 115/78  ?Pulse: 74 77  ?Resp: 14 16  ?Temp:    ?SpO2: 95% 100%  ? ? ? ?General: Awake, ill-appearing ?CV:  Good peripheral perfusion.  ?Resp:  Normal effort.  No increased work of breathing ?Abd:  No distention.  Soft nontender ?Neuro:              ?Left pupil status post surgery, right pupil equal and reactive ?Patient will briefly track but then closes her eyes ?Opens eyes to sternal rub ?Withdraws to bring in bilateral upper and lower extremities ?Patient intermittently responding to commands but not speaking ? ? ?ED Results / Procedures / Treatments  ?Labs ?(all labs ordered are listed, but only abnormal results are displayed) ?Labs Reviewed  ?COMPREHENSIVE METABOLIC PANEL - Abnormal; Notable for the following components:  ?    Result Value  ? CO2 21 (*)   ? Glucose, Bld 132 (*)   ? BUN 25 (*)   ? Creatinine, Ser 1.52 (*)   ? GFR, Estimated 39 (*)   ? All other components within normal limits  ?LACTIC ACID, PLASMA - Abnormal; Notable for the  following components:  ? Lactic Acid, Venous 2.7 (*)   ? All other components within normal limits  ?URINE DRUG SCREEN, QUALITATIVE ([facility] ONLY) - Abnormal; Notable for the following components:  ? Tricyclic, Ur Screen POSITIVE (*)   ? All other components within normal limits  ?CBC WITH DIFFERENTIAL/PLATELET  ?LACTIC ACID, PLASMA  ?MAGNESIUM  ?CBG MONITORING, ED  ?TROPONIN I (HIGH SENSITIVITY)  ?TROPONIN I (HIGH SENSITIVITY)  ? ? ? ?EKG ? ?EKG interpretation performed by myself: NSR, nml axis, nml intervals, no acute ischemic changes ? ? ? ?RADIOLOGY ?I reviewed the CT scan of the brain which does not show any acute intracranial process; agree with radiology report  ? ? ? ?PROCEDURES: ? ?Critical Care performed: No ? ?.1-3 Lead EKG Interpretation ?Performed by: Georga Hacking, MD ?Authorized by: Georga Hacking, MD  ? ?  Interpretation: normal   ?  ECG rate assessment: normal   ?  Rhythm: sinus  rhythm   ?  Ectopy: none   ?  Conduction: normal   ? ?The patient is on the cardiac monitor to evaluate for evidence of arrhythmia and/or significant heart rate changes. ? ? ?MEDICATIONS ORDERED IN ED: ?Medications - No data to display ? ? ?IMPRESSION / MDM / ASSESSMENT AND PLAN / ED COURSE  ?I reviewed the triage vital signs and the nursing notes. ?             ?               ? ?Differential diagnosis includes, but is not limited to, CVA, hypoglycemia ICH, metabolic abnormality, functional disorder  ? ?Patient is a 61 year old female presents with altered mental status from dialysis.  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  Rapid response was called.  On arrival patient is being bagged although is spontaneously breathing.  Her sugar is 118.  She is minimally responsive.  Transported to the ED.  Patient protecting her airway saturating 100% on room air.  Vitals are within normal limits.  She does intermittently open her eyes and seem to track  and can also intermittently follow commands.  Does withdraw in all 4 extremities.  Asked to talk patient is tearful but not able to get words out.  Presentation not completely fitting with CVA but given this acute change in mental status with inability to speak stroke alert was called.  CT head had already been obtained is negative for acute bleed.  Dr. Amada Jupiter evaluating patient at bedside feels exam is more likely to be functional so we will hold on tPA and obtain stat MRI and MRA.  Chest pain we will also obtain troponin EKG and CBC CMP. ? ?MRI and MRA are negative for acute process.  On reassessment patient now is up and walking, still not talking but is mouthing words.  Labs are all reassuring troponin x2 is negative EKG is nonischemic CBC CMP overall reassuring as well. ? ?On reassessment again patient is now speaking is alert and oriented x3.  Suspect this was a functional episode.  Confirmed with Dr. Amada Jupiter and he agrees with discharge home.  Discussed reducing stressors and PCP follow-up.  Return for any new neurologic symptoms. ? ?  ? ? ?FINAL CLINICAL IMPRESSION(S) / ED DIAGNOSES  ? ?Final diagnoses:  ?Weakness  ? ? ? ?Rx / DC Orders  ? ?ED Discharge Orders   ? ? None  ? ?  ? ? ? ?Note:  This document was prepared using Dragon voice recognition software and may include unintentional dictation errors. ?  ?Georga Hacking, MD ?07/15/21 1635 ? ?

## 2021-07-15 NOTE — ED Notes (Signed)
Pt was noted to withdraw to pain to all 4 extremities, less reaction noted to L hand (needle). EDP at bedside again. Pt is at this moment tracking with eyes and appears to be attempting to follow commands.  ? ?Neurologist now at bedside. ?

## 2021-07-15 NOTE — ED Notes (Signed)
Stroke coordinator RN at bedside

## 2021-07-15 NOTE — ED Notes (Signed)
Code stroke cancelled by Dr. Amada Jupiter  1225 ?

## 2021-07-15 NOTE — ED Triage Notes (Signed)
Pt was a code blue. Pt does have a pulse and resp with stable o2 sats. Per coworkers, pt was complaining of chest pain all morning. Pt than collapsed at work. Pt is [employer] of a 3. Pt does have a right sided deviation of her eye. BGL 118 ?

## 2021-07-15 NOTE — Code Documentation (Signed)
Stroke Response Nurse Documentation ?Code Documentation ? ?Abir Eroh Janeice Stegall is a 61 y.o. female arriving to [facility] ED via  stretcher from Hemodialysis where she was at work when she began complaining of chest pain and then had a syncopal episode  on 07/15/2021 with past medical hx of CKD, HTN,  and DM. On No antithrombotic. Code stroke was activated by EDP after arrival to ED room.  ? ?Patient was LKW at 1115 when her co-workers called a code blue after a syncopal episode and now complaining of left sided weakness and pt will not speak.  ? ?Stroke team at the bedside on patient arrival. Labs drawn and patient cleared for CT by Dr. Sidney Ace. Patient to CT prior to team arrival. The following imaging was completed:   CT, MRI, MRA. Patient is not a candidate for IV Thrombolytic per Dr Amada Jupiter- pt does not appear to be having a stroke.  ?Care/Plan: Pt taken to MRI/MRA which was neg for stroke- code stroke cancelled at 1223.  ? ?Bedside handoff with ED RN Barbee Cough.   ? ?Beverly Milch ?Stroke Coordinator RN ?  ?

## 2021-07-15 NOTE — Consult Note (Signed)
Neurology Consultation ?Reason for Consult: Left sided weakness ?Referring Physician: Sidney Ace, K ? ?CC: Left-sided weakness ? ?History is obtained from: Patient ? ?HPI: Cindy Cantu is a 61 y.o. 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  On arrival to the emergency department, she was not speaking, but was able to understand very clearly and able to write without difficulty.  Whenever she tried to speak a word, she would not have difficulty opening her mouth, and then would just make nonsensical sounds.  She was able to write fluently without difficulty. ? ?A code stroke was activated and she was taken for an emergent head CT which was negative.  Due to her CKD, she refused IV contrast and was taken for an emergent MRI/MRA which was also negative. ? ? ?LKW: 11:15 AM ?tpa given?: no, not a stroke ? ? ?ROS:  Unable to obtain due to altered mental status.  ? ?Past medical history: ?DM2 ?Kidney disease ? ? ?No family history on file. ? ? ?Social History:  has no history on file for tobacco use, alcohol use, and drug use. ? ? ?Exam: ?Current vital signs: ?BP 131/84   Pulse 75   Temp (!) 97.4 ?F (36.3 ?C) (Axillary)   Resp 17   Wt 113.4 kg   LMP  (LMP Unknown)   SpO2 100%  ?Vital signs in last 24 hours: ?Temp:  [97.4 ?F (36.3 ?C)] 97.4 ?F (36.3 ?C) (03/27 1126) ?Pulse Rate:  [65-82] 75 (03/27 1400) ?Resp:  [11-19] 17 (03/27 1400) ?BP: (113-133)/(64-84) 131/84 (03/27 1345) ?SpO2:  [100 %] 100 % (03/27 1400) ?Weight:  [113.4 kg] 113.4 kg (03/27 1127) ? ? ?Physical Exam  ?Constitutional: Appears well-developed and well-nourished.  ? ?Neuro: ?Mental Status: ?Patient is awake, alert, able to follow commands readily, able to write fluently ?She is unable to speak ?Cranial Nerves: ?II: Visual Fields are full. Pupils are equal,  round, and reactive to light.   ?III,IV, VI: EOMI without ptosis or diploplia.  ?VII: Facial movement is symmetric.  ?VIII: hearing is intact to voice ?X: Uvula elevates symmetrically ?XI: Shoulder shrug is symmetric. ?XII: tongue is midline without atrophy or fasciculations.  ?Motor: ?She has give way weakness of both the left arm and leg. She has a markedly inconsistent exam that changes with distraction.  ?Sensory: ?Sensation is diminished on the left.  ?Cerebellar: ?FNF and HKS are intact on the right ? ? ? ? ? ?I have reviewed labs in epic and the results pertinent to this consultation are: ?Cr 1.52 ? ? ?I have reviewed the images obtained:CT - negative ?MRI/MRA - negative ? ?Impression: 61 yo F with left sided weakness, inconsistent exam and negative imaging. 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  ? ?Recommendations: ?1) MRI/MRA initially recommended and is negative at the time of finalizing this note.  ?2) consider migraine cocktail ?3) Neurology will be available as needed ? ? ?Ritta Slot, MD ?[facility] ?[phone] ? ?If 7pm- 7am, please page neurology on call as listed in [facility]. ? ?

## 2021-07-15 NOTE — Progress Notes (Signed)
Chaplain Maggie responded to rapid response that turned to code blue. Pt was moved to ED. Pt unresponsive. No family present. Continued support available per on call chaplain. ?

## 2021-07-15 NOTE — ED Notes (Signed)
Pt discharged by other RN

## 2021-07-17 LAB — CBG MONITORING, ED: Glucose-Capillary: 118 mg/dL — ABNORMAL HIGH (ref 70–99)

## 2021-12-06 ENCOUNTER — Other Ambulatory Visit: Payer: Self-pay

## 2021-12-06 MED ORDER — PHENTERMINE HCL 37.5 MG PO TABS
ORAL_TABLET | ORAL | 0 refills | Status: DC
  Filled 2022-01-16: qty 90, 90d supply, fill #0

## 2022-01-14 ENCOUNTER — Other Ambulatory Visit: Payer: Self-pay

## 2022-01-16 ENCOUNTER — Other Ambulatory Visit: Payer: Self-pay

## 2022-01-17 ENCOUNTER — Other Ambulatory Visit: Payer: Self-pay

## 2022-01-17 MED ORDER — TIZANIDINE HCL 2 MG PO TABS
ORAL_TABLET | ORAL | 0 refills | Status: DC
  Filled 2022-01-17: qty 15, 5d supply, fill #0
  Filled 2022-01-17: qty 255, 85d supply, fill #0

## 2022-01-17 MED ORDER — TOPIRAMATE 50 MG PO TABS
ORAL_TABLET | ORAL | 0 refills | Status: DC
  Filled 2022-01-17: qty 270, 90d supply, fill #0

## 2022-01-20 ENCOUNTER — Other Ambulatory Visit: Payer: Self-pay

## 2022-01-22 ENCOUNTER — Other Ambulatory Visit: Payer: Self-pay

## 2022-04-15 ENCOUNTER — Other Ambulatory Visit: Payer: Self-pay

## 2022-04-15 MED ORDER — PHENTERMINE HCL 37.5 MG PO TABS
ORAL_TABLET | ORAL | 0 refills | Status: DC
  Filled 2022-04-15: qty 90, 90d supply, fill #0

## 2022-04-15 MED ORDER — TOPIRAMATE 50 MG PO TABS
ORAL_TABLET | ORAL | 0 refills | Status: AC
Start: 2022-04-15 — End: ?
  Filled 2022-04-15: qty 270, 90d supply, fill #0

## 2022-04-18 ENCOUNTER — Other Ambulatory Visit (HOSPITAL_COMMUNITY): Payer: Self-pay

## 2022-04-18 ENCOUNTER — Other Ambulatory Visit: Payer: Self-pay

## 2022-04-18 MED ORDER — TIZANIDINE HCL 2 MG PO TABS
ORAL_TABLET | ORAL | 0 refills | Status: AC
  Filled 2022-04-18: qty 270, 90d supply, fill #0

## 2022-04-18 MED ORDER — JARDIANCE 10 MG PO TABS
10.0000 mg | ORAL_TABLET | Freq: Every day | ORAL | 3 refills | Status: AC
  Filled 2022-04-18: qty 90, 90d supply, fill #0
  Filled 2022-07-18: qty 90, 90d supply, fill #1
  Filled 2022-11-05: qty 90, 90d supply, fill #2

## 2022-04-22 ENCOUNTER — Other Ambulatory Visit: Payer: Self-pay

## 2022-04-22 DIAGNOSIS — M79661 Pain in right lower leg: Secondary | ICD-10-CM | POA: Diagnosis not present

## 2022-04-22 DIAGNOSIS — N1832 Chronic kidney disease, stage 3b: Secondary | ICD-10-CM | POA: Diagnosis not present

## 2022-04-22 DIAGNOSIS — M791 Myalgia, unspecified site: Secondary | ICD-10-CM | POA: Diagnosis not present

## 2022-04-22 DIAGNOSIS — J101 Influenza due to other identified influenza virus with other respiratory manifestations: Secondary | ICD-10-CM | POA: Diagnosis not present

## 2022-04-22 DIAGNOSIS — R051 Acute cough: Secondary | ICD-10-CM | POA: Diagnosis not present

## 2022-04-22 MED ORDER — MUCINEX DM 30-600 MG PO TB12: ORAL_TABLET | ORAL | 0 refills | Status: AC

## 2022-04-22 MED ORDER — OSELTAMIVIR PHOSPHATE 30 MG PO CAPS
30.0000 mg | ORAL_CAPSULE | Freq: Two times a day (BID) | ORAL | 0 refills | Status: AC
  Filled 2022-04-22: qty 10, 5d supply, fill #0

## 2022-04-22 MED ORDER — BENZONATATE 200 MG PO CAPS
200.0000 mg | ORAL_CAPSULE | Freq: Three times a day (TID) | ORAL | 0 refills | Status: AC
Start: 2022-04-22 — End: ?
  Filled 2022-04-22: qty 20, 10d supply, fill #0

## 2022-04-22 MED ORDER — BENZONATATE 200 MG PO CAPS
ORAL_CAPSULE | ORAL | 0 refills | Status: AC
  Filled 2022-04-22: qty 20, 7d supply, fill #0

## 2022-04-25 DIAGNOSIS — E1122 Type 2 diabetes mellitus with diabetic chronic kidney disease: Secondary | ICD-10-CM | POA: Diagnosis not present

## 2022-04-25 DIAGNOSIS — R059 Cough, unspecified: Secondary | ICD-10-CM | POA: Diagnosis not present

## 2022-04-25 DIAGNOSIS — Z1152 Encounter for screening for COVID-19: Secondary | ICD-10-CM | POA: Diagnosis not present

## 2022-04-25 DIAGNOSIS — R791 Abnormal coagulation profile: Secondary | ICD-10-CM | POA: Diagnosis not present

## 2022-04-25 DIAGNOSIS — J101 Influenza due to other identified influenza virus with other respiratory manifestations: Secondary | ICD-10-CM | POA: Diagnosis not present

## 2022-04-25 DIAGNOSIS — R7989 Other specified abnormal findings of blood chemistry: Secondary | ICD-10-CM | POA: Diagnosis not present

## 2022-04-25 DIAGNOSIS — Z9049 Acquired absence of other specified parts of digestive tract: Secondary | ICD-10-CM | POA: Diagnosis not present

## 2022-04-25 DIAGNOSIS — R071 Chest pain on breathing: Secondary | ICD-10-CM | POA: Diagnosis not present

## 2022-04-25 DIAGNOSIS — I129 Hypertensive chronic kidney disease with stage 1 through stage 4 chronic kidney disease, or unspecified chronic kidney disease: Secondary | ICD-10-CM | POA: Diagnosis not present

## 2022-04-25 DIAGNOSIS — R509 Fever, unspecified: Secondary | ICD-10-CM | POA: Diagnosis not present

## 2022-04-25 DIAGNOSIS — N189 Chronic kidney disease, unspecified: Secondary | ICD-10-CM | POA: Diagnosis not present

## 2022-04-25 DIAGNOSIS — E785 Hyperlipidemia, unspecified: Secondary | ICD-10-CM | POA: Diagnosis not present

## 2022-04-25 DIAGNOSIS — Z20822 Contact with and (suspected) exposure to covid-19: Secondary | ICD-10-CM | POA: Diagnosis not present

## 2022-04-25 DIAGNOSIS — R918 Other nonspecific abnormal finding of lung field: Secondary | ICD-10-CM | POA: Diagnosis not present

## 2022-04-25 DIAGNOSIS — R0602 Shortness of breath: Secondary | ICD-10-CM | POA: Diagnosis not present

## 2022-04-25 DIAGNOSIS — R590 Localized enlarged lymph nodes: Secondary | ICD-10-CM | POA: Diagnosis not present

## 2022-04-25 DIAGNOSIS — M791 Myalgia, unspecified site: Secondary | ICD-10-CM | POA: Diagnosis not present

## 2022-04-25 DIAGNOSIS — J479 Bronchiectasis, uncomplicated: Secondary | ICD-10-CM | POA: Diagnosis not present

## 2022-05-02 DIAGNOSIS — R0609 Other forms of dyspnea: Secondary | ICD-10-CM | POA: Diagnosis not present

## 2022-05-21 ENCOUNTER — Other Ambulatory Visit: Payer: Self-pay

## 2022-05-23 ENCOUNTER — Other Ambulatory Visit: Payer: Self-pay

## 2022-05-23 MED ORDER — SPIRONOLACTONE 50 MG PO TABS
50.0000 mg | ORAL_TABLET | Freq: Every day | ORAL | 3 refills | Status: DC
  Filled 2022-05-23: qty 90, 90d supply, fill #0

## 2022-05-23 MED ORDER — SPIRONOLACTONE 50 MG PO TABS
50.0000 mg | ORAL_TABLET | Freq: Every day | ORAL | 3 refills | Status: AC
  Filled 2022-05-23: qty 90, 90d supply, fill #0

## 2022-05-27 ENCOUNTER — Other Ambulatory Visit: Payer: Self-pay

## 2022-06-06 ENCOUNTER — Other Ambulatory Visit: Payer: Self-pay

## 2022-06-08 ENCOUNTER — Other Ambulatory Visit: Payer: Self-pay

## 2022-06-08 MED ORDER — AMITRIPTYLINE HCL 50 MG PO TABS
50.0000 mg | ORAL_TABLET | Freq: Every day | ORAL | 3 refills | Status: AC
  Filled 2022-06-08: qty 90, 90d supply, fill #0
  Filled 2022-09-16: qty 90, 90d supply, fill #1
  Filled 2022-12-17: qty 90, 90d supply, fill #2

## 2022-06-09 ENCOUNTER — Other Ambulatory Visit: Payer: Self-pay

## 2022-06-10 ENCOUNTER — Other Ambulatory Visit: Payer: Self-pay

## 2022-07-14 ENCOUNTER — Other Ambulatory Visit: Payer: Self-pay

## 2022-07-15 ENCOUNTER — Other Ambulatory Visit: Payer: Self-pay

## 2022-07-15 DIAGNOSIS — E669 Obesity, unspecified: Secondary | ICD-10-CM | POA: Diagnosis not present

## 2022-07-15 DIAGNOSIS — K068 Other specified disorders of gingiva and edentulous alveolar ridge: Secondary | ICD-10-CM | POA: Diagnosis not present

## 2022-07-15 DIAGNOSIS — R5383 Other fatigue: Secondary | ICD-10-CM | POA: Diagnosis not present

## 2022-07-15 DIAGNOSIS — N1832 Chronic kidney disease, stage 3b: Secondary | ICD-10-CM | POA: Diagnosis not present

## 2022-07-15 DIAGNOSIS — E118 Type 2 diabetes mellitus with unspecified complications: Secondary | ICD-10-CM | POA: Diagnosis not present

## 2022-07-15 MED ORDER — PHENTERMINE HCL 37.5 MG PO TABS
37.5000 mg | ORAL_TABLET | Freq: Every morning | ORAL | 0 refills | Status: DC
  Filled 2022-07-15: qty 90, 90d supply, fill #0

## 2022-07-15 MED ORDER — SPIRONOLACTONE 25 MG PO TABS
25.0000 mg | ORAL_TABLET | Freq: Every day | ORAL | 3 refills | Status: AC | PRN
  Filled 2022-07-15: qty 9, 9d supply, fill #0
  Filled 2022-07-15: qty 21, 21d supply, fill #0
  Filled 2022-10-12: qty 30, 30d supply, fill #1
  Filled 2022-11-19: qty 30, 30d supply, fill #2

## 2022-07-18 ENCOUNTER — Other Ambulatory Visit: Payer: Self-pay

## 2022-07-29 DIAGNOSIS — R5383 Other fatigue: Secondary | ICD-10-CM | POA: Diagnosis not present

## 2022-07-29 DIAGNOSIS — E118 Type 2 diabetes mellitus with unspecified complications: Secondary | ICD-10-CM | POA: Diagnosis not present

## 2022-07-29 DIAGNOSIS — N1832 Chronic kidney disease, stage 3b: Secondary | ICD-10-CM | POA: Diagnosis not present

## 2022-08-29 ENCOUNTER — Other Ambulatory Visit: Payer: Self-pay

## 2022-08-29 MED ORDER — TIZANIDINE HCL 2 MG PO TABS
2.0000 mg | ORAL_TABLET | Freq: Three times a day (TID) | ORAL | 0 refills | Status: DC | PRN
  Filled 2022-08-29: qty 270, 90d supply, fill #0

## 2022-08-29 MED ORDER — METFORMIN HCL 500 MG PO TABS
500.0000 mg | ORAL_TABLET | Freq: Every day | ORAL | 1 refills | Status: AC
  Filled 2022-08-29: qty 90, 90d supply, fill #0
  Filled 2022-11-19 – 2022-12-17 (×2): qty 90, 90d supply, fill #1

## 2022-09-01 ENCOUNTER — Other Ambulatory Visit: Payer: Self-pay

## 2022-09-14 DIAGNOSIS — N189 Chronic kidney disease, unspecified: Secondary | ICD-10-CM | POA: Diagnosis not present

## 2022-09-14 DIAGNOSIS — R109 Unspecified abdominal pain: Secondary | ICD-10-CM | POA: Diagnosis not present

## 2022-09-14 DIAGNOSIS — E1129 Type 2 diabetes mellitus with other diabetic kidney complication: Secondary | ICD-10-CM | POA: Diagnosis not present

## 2022-09-14 DIAGNOSIS — K273 Acute peptic ulcer, site unspecified, without hemorrhage or perforation: Secondary | ICD-10-CM | POA: Diagnosis not present

## 2022-09-14 DIAGNOSIS — I10 Essential (primary) hypertension: Secondary | ICD-10-CM | POA: Diagnosis not present

## 2022-09-14 DIAGNOSIS — K5792 Diverticulitis of intestine, part unspecified, without perforation or abscess without bleeding: Secondary | ICD-10-CM | POA: Diagnosis not present

## 2022-09-20 DIAGNOSIS — M25562 Pain in left knee: Secondary | ICD-10-CM | POA: Diagnosis not present

## 2022-09-20 DIAGNOSIS — E1122 Type 2 diabetes mellitus with diabetic chronic kidney disease: Secondary | ICD-10-CM | POA: Diagnosis not present

## 2022-09-20 DIAGNOSIS — I129 Hypertensive chronic kidney disease with stage 1 through stage 4 chronic kidney disease, or unspecified chronic kidney disease: Secondary | ICD-10-CM | POA: Diagnosis not present

## 2022-09-20 DIAGNOSIS — N183 Chronic kidney disease, stage 3 unspecified: Secondary | ICD-10-CM | POA: Diagnosis not present

## 2022-10-01 DIAGNOSIS — I471 Supraventricular tachycardia, unspecified: Secondary | ICD-10-CM | POA: Diagnosis not present

## 2022-10-01 DIAGNOSIS — Z6841 Body Mass Index (BMI) 40.0 and over, adult: Secondary | ICD-10-CM | POA: Diagnosis not present

## 2022-10-01 DIAGNOSIS — Z8639 Personal history of other endocrine, nutritional and metabolic disease: Secondary | ICD-10-CM | POA: Diagnosis not present

## 2022-10-01 DIAGNOSIS — N1832 Chronic kidney disease, stage 3b: Secondary | ICD-10-CM | POA: Diagnosis not present

## 2022-10-01 DIAGNOSIS — D631 Anemia in chronic kidney disease: Secondary | ICD-10-CM | POA: Diagnosis not present

## 2022-10-01 DIAGNOSIS — I129 Hypertensive chronic kidney disease with stage 1 through stage 4 chronic kidney disease, or unspecified chronic kidney disease: Secondary | ICD-10-CM | POA: Diagnosis not present

## 2022-10-01 DIAGNOSIS — I493 Ventricular premature depolarization: Secondary | ICD-10-CM | POA: Diagnosis not present

## 2022-10-14 ENCOUNTER — Other Ambulatory Visit: Payer: Self-pay

## 2022-10-14 DIAGNOSIS — M25562 Pain in left knee: Secondary | ICD-10-CM | POA: Diagnosis not present

## 2022-10-14 DIAGNOSIS — Z780 Asymptomatic menopausal state: Secondary | ICD-10-CM | POA: Diagnosis not present

## 2022-10-14 DIAGNOSIS — G8929 Other chronic pain: Secondary | ICD-10-CM | POA: Diagnosis not present

## 2022-10-14 DIAGNOSIS — F4320 Adjustment disorder, unspecified: Secondary | ICD-10-CM | POA: Diagnosis not present

## 2022-10-14 DIAGNOSIS — Z6841 Body Mass Index (BMI) 40.0 and over, adult: Secondary | ICD-10-CM | POA: Diagnosis not present

## 2022-10-14 MED ORDER — TOPIRAMATE 50 MG PO TABS
150.0000 mg | ORAL_TABLET | Freq: Every day | ORAL | 0 refills | Status: AC
  Filled 2022-10-14: qty 270, 90d supply, fill #0

## 2022-10-14 MED ORDER — PHENTERMINE HCL 37.5 MG PO TABS
37.5000 mg | ORAL_TABLET | Freq: Every day | ORAL | 0 refills | Status: AC
  Filled 2022-10-14: qty 90, 90d supply, fill #0

## 2022-11-14 DIAGNOSIS — H60502 Unspecified acute noninfective otitis externa, left ear: Secondary | ICD-10-CM | POA: Diagnosis not present

## 2022-11-20 ENCOUNTER — Other Ambulatory Visit: Payer: Self-pay

## 2022-12-01 ENCOUNTER — Other Ambulatory Visit: Payer: Self-pay

## 2022-12-04 ENCOUNTER — Other Ambulatory Visit: Payer: Self-pay

## 2022-12-17 ENCOUNTER — Other Ambulatory Visit: Payer: Self-pay

## 2022-12-18 ENCOUNTER — Other Ambulatory Visit: Payer: Self-pay

## 2022-12-18 MED ORDER — TIZANIDINE HCL 2 MG PO TABS
2.0000 mg | ORAL_TABLET | Freq: Three times a day (TID) | ORAL | 1 refills | Status: AC | PRN
  Filled 2022-12-18: qty 270, 90d supply, fill #0
